# Patient Record
Sex: Male | Born: 1965 | Hispanic: Yes | Marital: Single | Smoking: Never smoker
Health system: Southern US, Community
[De-identification: ages and names within clinical notes are randomized; demographics above are authoritative.]

## PROBLEM LIST (undated history)

## (undated) DIAGNOSIS — G309 Alzheimer's disease, unspecified: Secondary | ICD-10-CM

## (undated) DIAGNOSIS — R4701 Aphasia: Secondary | ICD-10-CM

## (undated) DIAGNOSIS — Q909 Down syndrome, unspecified: Secondary | ICD-10-CM

## (undated) DIAGNOSIS — E119 Type 2 diabetes mellitus without complications: Secondary | ICD-10-CM

## (undated) DIAGNOSIS — R569 Unspecified convulsions: Secondary | ICD-10-CM

## (undated) DIAGNOSIS — E039 Hypothyroidism, unspecified: Secondary | ICD-10-CM

## (undated) DIAGNOSIS — E785 Hyperlipidemia, unspecified: Secondary | ICD-10-CM

## (undated) DIAGNOSIS — F0280 Dementia in other diseases classified elsewhere without behavioral disturbance: Secondary | ICD-10-CM

## (undated) HISTORY — PX: DENTAL SURGERY: SHX609

---

## 2015-10-18 ENCOUNTER — Encounter: Payer: Self-pay | Admitting: Emergency Medicine

## 2015-10-18 ENCOUNTER — Emergency Department: Payer: Medicare (Managed Care)

## 2015-10-18 ENCOUNTER — Emergency Department
Admission: EM | Admit: 2015-10-18 | Discharge: 2015-10-18 | Disposition: A | Discharge status: Home / Self Care | Payer: Medicare (Managed Care) | Attending: Emergency Medicine | Admitting: Emergency Medicine

## 2015-10-18 DIAGNOSIS — Y999 Unspecified external cause status: Secondary | ICD-10-CM | POA: Diagnosis not present

## 2015-10-18 DIAGNOSIS — Y9389 Activity, other specified: Secondary | ICD-10-CM | POA: Insufficient documentation

## 2015-10-18 DIAGNOSIS — W228XXA Striking against or struck by other objects, initial encounter: Secondary | ICD-10-CM | POA: Diagnosis not present

## 2015-10-18 DIAGNOSIS — S0990XA Unspecified injury of head, initial encounter: Secondary | ICD-10-CM | POA: Diagnosis present

## 2015-10-18 DIAGNOSIS — G309 Alzheimer's disease, unspecified: Secondary | ICD-10-CM | POA: Insufficient documentation

## 2015-10-18 DIAGNOSIS — Y9289 Other specified places as the place of occurrence of the external cause: Secondary | ICD-10-CM | POA: Insufficient documentation

## 2015-10-18 DIAGNOSIS — Q909 Down syndrome, unspecified: Secondary | ICD-10-CM | POA: Diagnosis not present

## 2015-10-18 DIAGNOSIS — E119 Type 2 diabetes mellitus without complications: Secondary | ICD-10-CM | POA: Insufficient documentation

## 2015-10-18 HISTORY — DX: Type 2 diabetes mellitus without complications: E11.9

## 2015-10-18 HISTORY — DX: Alzheimer's disease, unspecified: G30.9

## 2015-10-18 HISTORY — DX: Dementia in other diseases classified elsewhere, unspecified severity, without behavioral disturbance, psychotic disturbance, mood disturbance, and anxiety: F02.80

## 2015-10-18 HISTORY — DX: Down syndrome, unspecified: Q90.9

## 2015-10-18 LAB — GLUCOSE, CAPILLARY: Glucose-Capillary: 159 mg/dL — ABNORMAL HIGH (ref 65–99)

## 2015-10-18 MED ORDER — LORAZEPAM 1 MG PO TABS
2.0000 mg | ORAL_TABLET | Freq: Once | ORAL | Status: AC
  Administered 2015-10-18: 2 mg via ORAL
  Filled 2015-10-18: qty 2

## 2015-10-18 NOTE — ED Provider Notes (Signed)
[facility] Emergency Department Provider Note   ____________________________________________   First MD Initiated Contact with Patient 10/18/15 1943     (approximate)  I have reviewed the triage vital signs and the nursing notes.   HISTORY History is given by brother and staff worker secondary to the patient being autistic. Chief Complaint Fall    HPI Martin Randolph is a 50 y.o. male patient presented ED secondary to a head contusion. Patient fell and hit his head on a fountain. Patient was at the doctor's office this occurred. Patient was sent over to his CT of head and also requests for chest CT. Chest CT and head CT is requested by treating doctor. Brother stated there is no loss of conscious patient mentality is a baseline patient is autistic.Patient staff work is also with the patient and she states there is no loss in his baseline mentality.   Past Medical History:  Diagnosis Date  . Alzheimer's dementia   . Diabetes (HCC)   . Down syndrome     There are no active problems to display for this patient.   Past Surgical History:  Procedure Laterality Date  . DENTAL SURGERY      Prior to Admission medications   Not on File    Allergies Review of patient's allergies indicates no known allergies.  History reviewed. No pertinent family history.  Social History Social History  Substance Use Topics  . Smoking status: Never Smoker  . Smokeless tobacco: Never Used  . Alcohol use No    Review of Systems Constitutional: No fever/chills Eyes: No visual changes. ENT: No sore throat. Cardiovascular: Denies chest pain. Respiratory: Denies shortness of breath. Gastrointestinal: No abdominal pain.  No nausea, no vomiting.  No diarrhea.  No constipation. Genitourinary: Negative for dysuria. Musculoskeletal: Negative for back pain. Skin: Negative for rash. Neurological: Negative for headaches, focal weakness or numbness. Endocrine: Diabetes     ____________________________________________   PHYSICAL EXAM:  VITAL SIGNS: ED Triage Vitals  Enc Vitals Group     BP --      Pulse Rate 10/18/15 1721 99     Resp 10/18/15 1721 (!) 24     Temp 10/18/15 1721 98.1 F (36.7 C)     Temp Source 10/18/15 1721 Axillary     SpO2 10/18/15 1721 98 %     Weight 10/18/15 1722 113 lb (51.3 kg)     Height --      Head Circumference --      Peak Flow --      Pain Score --      Pain Loc --      Pain Edu? --      Excl. in GC? --     Constitutional: Alert and oriented. Well appearing and in no acute distress. Eyes: Conjunctivae are normal. PERRL. EOMI. Head: Atraumatic. Nose: No congestion/rhinnorhea. Mouth/Throat: Mucous membranes are moist.  Oropharynx non-erythematous. Neck: No stridor.  No cervical spine tenderness to palpation. Hematological/Lymphatic/Immunilogical: No cervical lymphadenopathy. Cardiovascular: Normal rate, regular rhythm. Grossly normal heart sounds.  Good peripheral circulation. Respiratory: Normal respiratory effort.  No retractions. Lungs CTAB. Gastrointestinal: Soft and nontender. No distention. No abdominal bruits. No CVA tenderness. Musculoskeletal: No lower extremity tenderness nor edema.  No joint effusions. Neurologic:  Patient is autistic Skin:  Skin is warm, dry and intact. No rash noted. Psychiatric: Mood and affect are normal. Speech and behavior are normal.  ____________________________________________   LABS (all labs ordered are listed, but only abnormal results are  displayed)  Labs Reviewed  GLUCOSE, CAPILLARY - Abnormal; Notable for the following:       Result Value   Glucose-Capillary 159 (*)    All other components within normal limits   ____________________________________________  EKG   ____________________________________________  RADIOLOGY   ____________________________________________   PROCEDURES  Procedure(s) performed: None  Procedures  Critical Care performed:  No  ____________________________________________   INITIAL IMPRESSION / ASSESSMENT AND PLAN / ED COURSE  Pertinent labs & imaging results that were available during my care of the patient were reviewed by me and considered in my medical decision making (see chart for details).  Forehead contusion. Patient brother and cares staff worker given discharge care instructions. Advised return by ER. Patient condition worsens.  Clinical Course   Status post Ativan 2 mg by mouth CT was unable to perform the procedure of scanning the head or chest. Discussed condition with brother and staff worker who concur that they noticed no baseline change in the patient's condition. They also noted that there is no abrasion or hematoma to the forehead from the impact.  ____________________________________________   FINAL CLINICAL IMPRESSION(S) / ED DIAGNOSES  Final diagnoses:  Minor head injury, initial encounter      NEW MEDICATIONS STARTED DURING THIS VISIT:  New Prescriptions   No medications on file     Note:  This document was prepared using Dragon voice recognition software and may include unintentional dictation errors.    Joni Reining, PA-C 10/18/15 2115    Loleta Rose, MD 10/19/15 [phone]

## 2015-10-18 NOTE — ED Notes (Signed)
Per patient's caregiver, the patient is unable to tolerate the CT scan.

## 2015-10-18 NOTE — ED Triage Notes (Signed)
Pt presents to ED s/p fall. Pt's brother states he has a hx of down's syndrome, dementia, alzheimers. Pt's brother presents to ED with him and states that patient was walking and fell into a water fountain. Pt hit L side of his head. Pt's brother denies LOC, denies any alterations in mental status at this time. Per brother pt is at baseline. No bruising noted at this time. Pt is reassured by his brother's presence.

## 2015-10-18 NOTE — ED Notes (Signed)
Pt is from [facility].  Pt leaned over and struck top of head on water fountain today.  No loc  No vomiting.  Caregiver with pt.  Pt alert.  Pt has down syndrome.  Care giver states pt acting appropriate for him.   No abrasion or lac noted.

## 2015-10-18 NOTE — ED Notes (Signed)
Patient given medication with peanut butter.

## 2015-10-29 ENCOUNTER — Encounter: Payer: Self-pay | Admitting: *Deleted

## 2015-10-29 ENCOUNTER — Inpatient Hospital Stay
Admission: EM | Admit: 2015-10-29 | Discharge: 2015-11-03 | DRG: 638 | Disposition: A | Discharge status: Home / Self Care | Payer: Medicare (Managed Care) | Attending: Internal Medicine | Admitting: Internal Medicine

## 2015-10-29 DIAGNOSIS — Q909 Down syndrome, unspecified: Secondary | ICD-10-CM | POA: Diagnosis not present

## 2015-10-29 DIAGNOSIS — Z79899 Other long term (current) drug therapy: Secondary | ICD-10-CM

## 2015-10-29 DIAGNOSIS — G4089 Other seizures: Secondary | ICD-10-CM | POA: Diagnosis not present

## 2015-10-29 DIAGNOSIS — F0390 Unspecified dementia without behavioral disturbance: Secondary | ICD-10-CM

## 2015-10-29 DIAGNOSIS — I959 Hypotension, unspecified: Secondary | ICD-10-CM | POA: Diagnosis not present

## 2015-10-29 DIAGNOSIS — E1169 Type 2 diabetes mellitus with other specified complication: Secondary | ICD-10-CM

## 2015-10-29 DIAGNOSIS — E11649 Type 2 diabetes mellitus with hypoglycemia without coma: Secondary | ICD-10-CM | POA: Diagnosis not present

## 2015-10-29 DIAGNOSIS — Z23 Encounter for immunization: Secondary | ICD-10-CM

## 2015-10-29 DIAGNOSIS — E119 Type 2 diabetes mellitus without complications: Secondary | ICD-10-CM

## 2015-10-29 DIAGNOSIS — E162 Hypoglycemia, unspecified: Secondary | ICD-10-CM | POA: Diagnosis present

## 2015-10-29 DIAGNOSIS — Z794 Long term (current) use of insulin: Secondary | ICD-10-CM

## 2015-10-29 DIAGNOSIS — F0280 Dementia in other diseases classified elsewhere without behavioral disturbance: Secondary | ICD-10-CM | POA: Diagnosis present

## 2015-10-29 DIAGNOSIS — R569 Unspecified convulsions: Secondary | ICD-10-CM

## 2015-10-29 LAB — COMPREHENSIVE METABOLIC PANEL
ALBUMIN: 3.8 g/dL (ref 3.5–5.0)
ALK PHOS: 68 U/L (ref 38–126)
ALT: 27 U/L (ref 17–63)
ANION GAP: 9 (ref 5–15)
AST: 31 U/L (ref 15–41)
BUN: 21 mg/dL — AB (ref 6–20)
CALCIUM: 9.3 mg/dL (ref 8.9–10.3)
CO2: 31 mmol/L (ref 22–32)
Chloride: 101 mmol/L (ref 101–111)
Creatinine, Ser: 0.88 mg/dL (ref 0.61–1.24)
GFR calc Af Amer: >60 mL/min (ref >60)
GFR calc non Af Amer: >60 mL/min (ref >60)
GLUCOSE: 32 mg/dL — AB (ref 65–99)
POTASSIUM: 4.5 mmol/L (ref 3.5–5.1)
SODIUM: 141 mmol/L (ref 135–145)
Total Bilirubin: 0.4 mg/dL (ref 0.3–1.2)
Total Protein: 6.9 g/dL (ref 6.5–8.1)

## 2015-10-29 LAB — CBC WITH DIFFERENTIAL/PLATELET
Basophils Absolute: 0.1 10*3/uL (ref 0–0.1)
Basophils Relative: 1 %
EOS PCT: 0 %
Eosinophils Absolute: 0 10*3/uL (ref 0–0.7)
HEMATOCRIT: 42.2 % (ref 40.0–52.0)
Hemoglobin: 14.1 g/dL (ref 13.0–18.0)
LYMPHS PCT: 17 %
Lymphs Abs: 1.5 10*3/uL (ref 1.0–3.6)
MCH: 34.4 pg — AB (ref 26.0–34.0)
MCHC: 33.5 g/dL (ref 32.0–36.0)
MCV: 102.7 fL — AB (ref 80.0–100.0)
MONO ABS: 1.2 10*3/uL — AB (ref 0.2–1.0)
MONOS PCT: 13 %
NEUTROS ABS: 6.2 10*3/uL (ref 1.4–6.5)
Neutrophils Relative %: 69 %
Platelets: 304 10*3/uL (ref 150–440)
RBC: 4.11 MIL/uL — ABNORMAL LOW (ref 4.40–5.90)
RDW: 14.5 % (ref 11.5–14.5)
WBC: 9 10*3/uL (ref 3.8–10.6)

## 2015-10-29 LAB — URINALYSIS COMPLETE WITH MICROSCOPIC (ARMC ONLY)
BACTERIA UA: NONE SEEN
Bilirubin Urine: NEGATIVE
Glucose, UA: >500 mg/dL — AB
Hgb urine dipstick: NEGATIVE
Leukocytes, UA: NEGATIVE
Nitrite: NEGATIVE
PH: 5 (ref 5.0–8.0)
Protein, ur: 30 mg/dL — AB
SQUAMOUS EPITHELIAL / LPF: NONE SEEN
Specific Gravity, Urine: 1.026 (ref 1.005–1.030)

## 2015-10-29 LAB — GLUCOSE, CAPILLARY
GLUCOSE-CAPILLARY: 129 mg/dL — AB (ref 65–99)
Glucose-Capillary: 139 mg/dL — ABNORMAL HIGH (ref 65–99)
Glucose-Capillary: 337 mg/dL — ABNORMAL HIGH (ref 65–99)
Glucose-Capillary: 95 mg/dL (ref 65–99)
Glucose-Capillary: <10 mg/dL — CL (ref 65–99)

## 2015-10-29 LAB — LIPASE, BLOOD: Lipase: 16 U/L (ref 11–51)

## 2015-10-29 MED ORDER — ACETAMINOPHEN 650 MG RE SUPP: 650.0000 mg | Freq: Four times a day (QID) | RECTAL | Status: DC | PRN

## 2015-10-29 MED ORDER — ONDANSETRON HCL 4 MG PO TABS: 4.0000 mg | ORAL_TABLET | Freq: Four times a day (QID) | ORAL | Status: DC | PRN

## 2015-10-29 MED ORDER — SODIUM CHLORIDE 0.9 % IV SOLN
INTRAVENOUS | Status: AC
  Administered 2015-10-30 (×2): via INTRAVENOUS

## 2015-10-29 MED ORDER — SODIUM CHLORIDE 0.9% FLUSH
3.0000 mL | Freq: Two times a day (BID) | INTRAVENOUS | Status: DC
  Administered 2015-10-30 – 2015-11-02 (×3): 3 mL via INTRAVENOUS

## 2015-10-29 MED ORDER — SIMVASTATIN 10 MG PO TABS
10.0000 mg | ORAL_TABLET | Freq: Every day | ORAL | Status: DC
  Administered 2015-10-30 – 2015-11-02 (×5): 10 mg via ORAL
  Filled 2015-10-29 (×5): qty 1

## 2015-10-29 MED ORDER — ENOXAPARIN SODIUM 40 MG/0.4ML ~~LOC~~ SOLN
40.0000 mg | Freq: Every day | SUBCUTANEOUS | Status: DC
  Administered 2015-10-30 – 2015-11-02 (×5): 40 mg via SUBCUTANEOUS
  Filled 2015-10-29 (×5): qty 0.4

## 2015-10-29 MED ORDER — DEXTROSE 50 % IV SOLN
1.0000 | Freq: Once | INTRAVENOUS | Status: AC
  Administered 2015-10-29: 50 mL via INTRAVENOUS

## 2015-10-29 MED ORDER — ACETAMINOPHEN 325 MG PO TABS
650.0000 mg | ORAL_TABLET | Freq: Four times a day (QID) | ORAL | Status: DC | PRN
  Administered 2015-11-01: 650 mg via ORAL
  Filled 2015-10-29: qty 2

## 2015-10-29 MED ORDER — DONEPEZIL HCL 5 MG PO TABS
10.0000 mg | ORAL_TABLET | Freq: Every day | ORAL | Status: DC
  Administered 2015-10-30 – 2015-11-02 (×5): 10 mg via ORAL
  Filled 2015-10-29 (×5): qty 2

## 2015-10-29 MED ORDER — ONDANSETRON HCL 4 MG/2ML IJ SOLN: 4.0000 mg | Freq: Four times a day (QID) | INTRAMUSCULAR | Status: DC | PRN

## 2015-10-29 MED ORDER — TAMSULOSIN HCL 0.4 MG PO CAPS
0.4000 mg | ORAL_CAPSULE | Freq: Every day | ORAL | Status: DC
  Administered 2015-10-30 – 2015-11-02 (×5): 0.4 mg via ORAL
  Filled 2015-10-29 (×5): qty 1

## 2015-10-29 MED ORDER — FAMOTIDINE 20 MG PO TABS
20.0000 mg | ORAL_TABLET | Freq: Every day | ORAL | Status: DC
  Administered 2015-10-30 – 2015-11-03 (×5): 20 mg via ORAL
  Filled 2015-10-29 (×5): qty 1

## 2015-10-29 MED ORDER — DIVALPROEX SODIUM 125 MG PO CSDR
250.0000 mg | DELAYED_RELEASE_CAPSULE | Freq: Three times a day (TID) | ORAL | Status: DC
  Administered 2015-10-30 – 2015-11-01 (×7): 250 mg via ORAL
  Filled 2015-10-29 (×7): qty 2

## 2015-10-29 MED ORDER — DEXTROSE 10 % IV SOLN
INTRAVENOUS | Status: DC
Start: 2015-10-29 — End: 2015-10-29
  Administered 2015-10-29: 21:00:00 via INTRAVENOUS

## 2015-10-29 MED ORDER — QUETIAPINE FUMARATE 25 MG PO TABS
25.0000 mg | ORAL_TABLET | Freq: Every day | ORAL | Status: DC
  Administered 2015-10-30 – 2015-11-02 (×4): 25 mg via ORAL
  Filled 2015-10-29 (×5): qty 1

## 2015-10-29 NOTE — ED Triage Notes (Addendum)
PT arrived to ED via EMS from [facility]. Pt was reported to have an elevated CBG of 543 at 1400. Per MD order pt was given 12 units of insulin at 1415. CBG upon recheck at 1500 was 489 and 12 more units given at approx.1514. Staff reported pt began to look tired and sluggish and gave pt unsweetened applesauce. No improvement noted and CBG checked again at 1744 pt's CBG was 31 and staff immediately administered 1 tube of oral glucose. Staff reports pt had 4 witnessed seizures that were 30 seconds each and 2 more three second seizures that staff describes as twitching into full body jerking. Upon EMS arrival CBG was 22 and ES started a line and gave 1 amp of D50. Upon arrival pt has CBG of 95. PT has hx of Alzhimers, Diabetes and Down's and is reported to be Nonverbal but able to follow commands. Pt following commands upon arrival but sleeping on and off. NAD noted. No trauma noted to tongue and no loss of bowels.

## 2015-10-29 NOTE — ED Provider Notes (Signed)
[facility] Emergency Department Provider Note  ____________________________________________   First MD Initiated Contact with Patient 10/29/15 1949     (approximate)  I have reviewed the triage vital signs and the nursing notes.   HISTORY  Chief Complaint Hypoglycemia  The patient has Down syndrome, early onset dementia, and possibly a history of TBI as well.  He is not able to provide any history.  HPI Martin Randolph is a 50 y.o. 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  The representative reports that the patient has widely variable blood sugars, ranging from the 400s to the 100s over a short period of time.  See nursing notes for specifics, but in summary, he had a FSBS that was well over 500, so he was given insulin.  A repeat FSBS showed another result in the mid-400s, so he received more insulin.  An hour later he was in the 90s, and then they noticed that he was very sleepy.  They checked and found a FSBS in the low 30s.  He was given dextrose PTA by EMS and his FSBS was in the 90s upon arrival to the ED.  His baseline is difficult to assess.  Group home feels he is more or less at his baseline "when his sugar is messed up."  He will occasionally wake up and yell and track [place] in the room, but is minimally cooperative.  Past Medical History:  Diagnosis Date  . Alzheimer's dementia   . Diabetes (HCC)   . Down syndrome     Patient Active Problem List   Diagnosis Date Noted  . Hypoglycemia 10/29/2015  . Diabetes (HCC) 10/29/2015  . Down syndrome 10/29/2015    Past Surgical History:  Procedure Laterality Date  . DENTAL SURGERY      Prior to Admission medications   Medication Sig Start Date End Date Taking? Authorizing Provider  acetaminophen (TYLENOL) 325 MG tablet Take  650 mg by mouth every 6 (six) hours as needed for mild pain.   Yes Historical Provider, MD  carbamide peroxide (DEBROX) 6.5 % otic solution Place 5-10 drops into both ears 2 (two) times daily.   Yes Historical Provider, MD  cetaphil (CETAPHIL) lotion Apply 1 application topically 2 (two) times daily.   Yes Historical Provider, MD  divalproex (DEPAKOTE SPRINKLE) 125 MG capsule Take 250 mg by mouth 3 (three) times daily.   Yes Historical Provider, MD  donepezil (ARICEPT) 10 MG tablet Take 10 mg by mouth at bedtime.   Yes Historical Provider, MD  famotidine (PEPCID) 20 MG tablet Take 20 mg by mouth daily.   Yes Historical Provider, MD  feeding supplement, GLUCERNA SHAKE, (GLUCERNA SHAKE) LIQD Take 237 mLs by mouth 3 (three) times daily between meals.   Yes Historical Provider, MD  gabapentin (NEURONTIN) 400 MG capsule Take 400 mg by mouth at bedtime.   Yes Historical Provider, MD  insulin glargine (LANTUS) 100 UNIT/ML injection Inject 12 Units into the skin at bedtime.   Yes Historical Provider, MD  insulin lispro (HUMALOG) 100 UNIT/ML injection Inject 0-10 Units into the skin 3 (three) times daily after meals.   Yes Historical Provider, MD  Melatonin 3 MG TABS Take 3 mg by mouth at bedtime.   Yes Historical Provider, MD  metFORMIN (GLUCOPHAGE) 1000 MG tablet Take 1,000 mg by mouth 2 (  two) times daily with a meal.   Yes Historical Provider, MD  polyethylene glycol (MIRALAX / GLYCOLAX) packet Take 17 g by mouth every 3 (three) days.   Yes Historical Provider, MD  QUEtiapine (SEROQUEL) 25 MG tablet Take 25 mg by mouth at bedtime.   Yes Historical Provider, MD  QUEtiapine (SEROQUEL) 25 MG tablet Take 25 mg by mouth 2 (two) times daily as needed (agitation).   Yes Historical Provider, MD  QUEtiapine (SEROQUEL) 50 MG tablet Take 50 mg by mouth 2 (two) times daily.   Yes Historical Provider, MD  simvastatin (ZOCOR) 10 MG tablet Take 10 mg by mouth at bedtime.   Yes Historical Provider, MD  tamsulosin  (FLOMAX) 0.4 MG CAPS capsule Take 0.4 mg by mouth at bedtime.   Yes Historical Provider, MD    Allergies Review of patient's allergies indicates no known allergies.  History reviewed. No pertinent family history.  Social History Social History  Substance Use Topics  . Smoking status: Never Smoker  . Smokeless tobacco: Never Used  . Alcohol use No    Review of Systems Level V caveat - unable to obtain due to chronic dementia and Down syndrome ____________________________________________   PHYSICAL EXAM:  VITAL SIGNS: ED Triage Vitals  Enc Vitals Group     BP 10/29/15 1946 103/77     Pulse Rate 10/29/15 1946 64     Resp 10/29/15 1946 16     Temp 10/29/15 1946 98.9 F (37.2 C)     Temp Source 10/29/15 1946 Oral     SpO2 10/29/15 1946 100 %     Weight 10/29/15 1948 113 lb (51.3 kg)     Height --      Head Circumference --      Peak Flow --      Pain Score --      Pain Loc --      Pain Edu? --      Excl. in GC? --     Constitutional: Somnolent, occasionally more alert, similar to baseline per group home representative Eyes: Conjunctivae are normal. PERRL. EOMI. Head: Atraumatic. Nose: No congestion/rhinnorhea. Mouth/Throat: Mucous membranes are moist.  Oropharynx non-erythematous. Neck: No stridor.  No meningeal signs.   Cardiovascular: Normal rate, regular rhythm. Good peripheral circulation. Grossly normal heart sounds. Respiratory: Normal respiratory effort.  No retractions. Lungs CTAB. Gastrointestinal: Soft and nontender. No distention.  Musculoskeletal: No lower extremity tenderness nor edema. No gross deformities of extremities. Neurologic:  Moving all four extremities.  Unable to participate in neuro exam.  No seizure-like activity. Skin:  Skin is warm, dry and intact. No rash noted.   ____________________________________________   LABS (all labs ordered are listed, but only abnormal results are displayed)  Labs Reviewed  COMPREHENSIVE METABOLIC PANEL  - Abnormal; Notable for the following:       Result Value   Glucose, Bld 32 (*)    BUN 21 (*)    All other components within normal limits  CBC WITH DIFFERENTIAL/PLATELET - Abnormal; Notable for the following:    RBC 4.11 (*)    MCV 102.7 (*)    MCH 34.4 (*)    Monocytes Absolute 1.2 (*)    All other components within normal limits  GLUCOSE, CAPILLARY - Abnormal; Notable for the following:    Glucose-Capillary <10 (*)    All other components within normal limits  URINALYSIS COMPLETEWITH MICROSCOPIC ([facility] ONLY) - Abnormal; Notable for the following:    Color, Urine YELLOW (*)  APPearance CLEAR (*)    Glucose, UA >500 (*)    Ketones, ur 1+ (*)    Protein, ur 30 (*)    All other components within normal limits  GLUCOSE, CAPILLARY - Abnormal; Notable for the following:    Glucose-Capillary 139 (*)    All other components within normal limits  GLUCOSE, CAPILLARY - Abnormal; Notable for the following:    Glucose-Capillary 129 (*)    All other components within normal limits  GLUCOSE, CAPILLARY  LIPASE, BLOOD  CBC  CREATININE, SERUM  BASIC METABOLIC PANEL  CBC  CBG MONITORING, ED  CBG MONITORING, ED  CBG MONITORING, ED  CBG MONITORING, ED  CBG MONITORING, ED  CBG MONITORING, ED  CBG MONITORING, ED  CBG MONITORING, ED  CBG MONITORING, ED  CBG MONITORING, ED  CBG MONITORING, ED  CBG MONITORING, ED  CBG MONITORING, ED  CBG MONITORING, ED  CBG MONITORING, ED   ____________________________________________  EKG  None - EKG not ordered by ED physician ____________________________________________  RADIOLOGY   No results found.  ____________________________________________   PROCEDURES  Procedure(s) performed:   .Critical Care Performed by: Loleta Rose Authorized by: Loleta Rose   Critical care provider statement:    Critical care time (minutes):  30   Critical care time was exclusive of:  Separately billable procedures and treating other patients    Critical care was necessary to treat or prevent imminent or life-threatening deterioration of the following conditions:  Metabolic crisis   Critical care was time spent personally by me on the following activities:  Development of treatment plan with patient or surrogate, discussions with consultants, evaluation of patient's response to treatment, examination of patient, obtaining history from patient or surrogate, ordering and performing treatments and interventions, ordering and review of laboratory studies, ordering and review of radiographic studies, pulse oximetry, re-evaluation of patient's condition and review of old charts      Critical Care performed: Yes, see critical care procedure note(s) ____________________________________________   INITIAL IMPRESSION / ASSESSMENT AND PLAN / ED COURSE  Pertinent labs & imaging results that were available during my care of the patient were reviewed by me and considered in my medical decision making (see chart for details).  Likely too much insulin in a short period of time.  No evidence of infectious process (metabolic panel and CBC pending, but VSS/afebrile.  No report of recent illness from group home.  Will monitor with q1hour  CBG checks, next check at 20:45.  Clinical Course  Comment By Time  The patient's fingerstick blood glucose was less than 10 (unmeasurable).  No seizure-like activity.  Administered one ampule of D50 and started on a D10 drip at 100 mL per hour. Loleta Rose, MD 09/25 2045  Refractory hypoglycemia requiring emergent intervention, concerned that he will have additional potentially life-threatening drops in blood sugar. Will admit for D10 drip and observation.  UA pending but blood work unremarkable. Loleta Rose, MD 09/25 2116    ____________________________________________  FINAL CLINICAL IMPRESSION(S) / ED DIAGNOSES  Final diagnoses:  Hypoglycemia  Down syndrome  Chronic dementia, without behavioral disturbance      MEDICATIONS GIVEN DURING THIS VISIT:  Medications  dextrose 10 % infusion ( Intravenous New Bag/Given 10/29/15 2048)  divalproex (DEPAKOTE SPRINKLE) capsule 250 mg (not administered)  donepezil (ARICEPT) tablet 10 mg (not administered)  simvastatin (ZOCOR) tablet 10 mg (not administered)  tamsulosin (FLOMAX) capsule 0.4 mg (not administered)  famotidine (PEPCID) tablet 20 mg (not administered)  enoxaparin (LOVENOX) injection 40 mg (  not administered)  sodium chloride flush (NS) 0.9 % injection 3 mL (not administered)  acetaminophen (TYLENOL) tablet 650 mg (not administered)    Or  acetaminophen (TYLENOL) suppository 650 mg (not administered)  ondansetron (ZOFRAN) tablet 4 mg (not administered)    Or  ondansetron (ZOFRAN) injection 4 mg (not administered)  dextrose 50 % solution 50 mL (50 mLs Intravenous Given 10/29/15 2048)     NEW OUTPATIENT MEDICATIONS STARTED DURING THIS VISIT:  New Prescriptions   No medications on file    Modified Medications   No medications on file    Discontinued Medications   No medications on file     Note:  This document was prepared using Dragon voice recognition software and may include unintentional dictation errors.    Loleta Rose, MD 10/29/15 [phone]

## 2015-10-29 NOTE — H&P (Signed)
[facility] - [place] [facility]   PATIENT NAME: Martin Randolph    MR#:  [id number]  DATE OF BIRTH:  1965-12-21  DATE OF ADMISSION:  10/29/2015  PRIMARY CARE PHYSICIAN: Pasty Spillers McLean-Scocuzza, MD   REQUESTING/REFERRING PHYSICIAN: York Cerise, MD  CHIEF COMPLAINT:   Chief Complaint  Patient presents with  . Hypoglycemia    HISTORY OF PRESENT ILLNESS:  Martin Randolph  is a 50 y.o. male who presents with hypoglycemia. Patient has Down syndrome and dementia and lives in a group home. He is nonverbal at baseline and history is taken from the group home worker who is with him in the ED. She states that he had elevated blood glucose values earlier today in the 400s and 500s. He was given correctional insulin 2 times. His blood sugar this evening was 93 and then subsequently in the 60s. EMS was called and they gave him glucose and his blood sugar improved. Here in the ED it dropped again and he was put on a dextrose drip and hospitalists were called for admission for observation.  PAST MEDICAL HISTORY:   Past Medical History:  Diagnosis Date  . Alzheimer's dementia   . Diabetes (HCC)   . Down syndrome     PAST SURGICAL HISTORY:   Past Surgical History:  Procedure Laterality Date  . DENTAL SURGERY      SOCIAL HISTORY:   Social History  Substance Use Topics  . Smoking status: Never Smoker  . Smokeless tobacco: Never Used  . Alcohol use No    FAMILY HISTORY:  History reviewed. No pertinent family history.  DRUG ALLERGIES:  No Known Allergies  MEDICATIONS AT HOME:   Prior to Admission medications   Medication Sig Start Date End Date Taking? Authorizing Provider  acetaminophen (TYLENOL) 325 MG tablet Take 650 mg by mouth every 6 (six) hours as needed for mild pain.   Yes Historical Provider, MD  carbamide peroxide (DEBROX) 6.5 % otic solution Place 5-10 drops into both ears 2 (two) times daily.   Yes Historical Provider, MD  cetaphil (CETAPHIL)  lotion Apply 1 application topically 2 (two) times daily.   Yes Historical Provider, MD  divalproex (DEPAKOTE SPRINKLE) 125 MG capsule Take 250 mg by mouth 3 (three) times daily.   Yes Historical Provider, MD  donepezil (ARICEPT) 10 MG tablet Take 10 mg by mouth at bedtime.   Yes Historical Provider, MD  famotidine (PEPCID) 20 MG tablet Take 20 mg by mouth daily.   Yes Historical Provider, MD  feeding supplement, GLUCERNA SHAKE, (GLUCERNA SHAKE) LIQD Take 237 mLs by mouth 3 (three) times daily between meals.   Yes Historical Provider, MD  gabapentin (NEURONTIN) 400 MG capsule Take 400 mg by mouth at bedtime.   Yes Historical Provider, MD  insulin glargine (LANTUS) 100 UNIT/ML injection Inject 12 Units into the skin at bedtime.   Yes Historical Provider, MD  insulin lispro (HUMALOG) 100 UNIT/ML injection Inject 0-10 Units into the skin 3 (three) times daily after meals.   Yes Historical Provider, MD  Melatonin 3 MG TABS Take 3 mg by mouth at bedtime.   Yes Historical Provider, MD  metFORMIN (GLUCOPHAGE) 1000 MG tablet Take 1,000 mg by mouth 2 (two) times daily with a meal.   Yes Historical Provider, MD  polyethylene glycol (MIRALAX / GLYCOLAX) packet Take 17 g by mouth every 3 (three) days.   Yes Historical Provider, MD  QUEtiapine (SEROQUEL) 25 MG tablet Take 25 mg by mouth at bedtime.  Yes Historical Provider, MD  QUEtiapine (SEROQUEL) 25 MG tablet Take 25 mg by mouth 2 (two) times daily as needed (agitation).   Yes Historical Provider, MD  QUEtiapine (SEROQUEL) 50 MG tablet Take 50 mg by mouth 2 (two) times daily.   Yes Historical Provider, MD  simvastatin (ZOCOR) 10 MG tablet Take 10 mg by mouth at bedtime.   Yes Historical Provider, MD  tamsulosin (FLOMAX) 0.4 MG CAPS capsule Take 0.4 mg by mouth at bedtime.   Yes Historical Provider, MD    REVIEW OF SYSTEMS:  Review of Systems  Unable to perform ROS: Dementia     VITAL SIGNS:   Vitals:   10/29/15 1946 10/29/15 1948  BP: 103/77    Pulse: 64   Resp: 16   Temp: 98.9 F (37.2 C)   TempSrc: Oral   SpO2: 100%   Weight:  51.3 kg (113 lb)   Wt Readings from Last 3 Encounters:  10/29/15 51.3 kg (113 lb)  10/18/15 51.3 kg (113 lb)    PHYSICAL EXAMINATION:  Physical Exam  Vitals reviewed. Constitutional: He appears well-developed and well-nourished. No distress.  HENT:  Head: Normocephalic and atraumatic.  Mouth/Throat: Oropharynx is clear and moist.  Eyes: Conjunctivae and EOM are normal. Pupils are equal, round, and reactive to light. No scleral icterus.  Neck: Normal range of motion. Neck supple. No JVD present. No thyromegaly present.  Cardiovascular: Normal rate, regular rhythm and intact distal pulses.  Exam reveals no gallop and no friction rub.   No murmur heard. Respiratory: Effort normal and breath sounds normal. No respiratory distress. He has no wheezes. He has no rales.  GI: Soft. Bowel sounds are normal. He exhibits no distension. There is no tenderness.  Musculoskeletal: Normal range of motion. He exhibits no edema.  No arthritis, no gout  Lymphadenopathy:    He has no cervical adenopathy.  Neurological: He is alert.  Unable to fully assess as patient does not follow commands at baseline  Skin: Skin is warm and dry. No rash noted. No erythema.  Psychiatric:  Unable to assess due to dementia    LABORATORY PANEL:   CBC  Recent Labs Lab 10/29/15 1949  WBC 9.0  HGB 14.1  HCT 42.2  PLT 304   ------------------------------------------------------------------------------------------------------------------  Chemistries   Recent Labs Lab 10/29/15 1949  NA 141  K 4.5  CL 101  CO2 31  GLUCOSE 32*  BUN 21*  CREATININE 0.88  CALCIUM 9.3  AST 31  ALT 27  ALKPHOS 68  BILITOT 0.4   ------------------------------------------------------------------------------------------------------------------  Cardiac Enzymes No results for input(s): TROPONINI in the last 168  hours. ------------------------------------------------------------------------------------------------------------------  RADIOLOGY:  No results found.  EKG:  No orders found for this or any previous visit.  IMPRESSION AND PLAN:  Principal Problem:   Hypoglycemia - suspect patient got somewhat appropriate correctional insulin today, but then likely had poor by mouth intake afterwards leading to hypoglycemia. He also has a history of difficult to control blood glucoses which tend to swing wildly from hyperglycemic to hypoglycemic, and vice versa. We'll have him on a dextrose drip here tonight until his blood sugars improve, then we can wean him off the drip and continue to monitor him. Active Problems:   Diabetes (HCC) - carb modified diet, glucose control as described above   Down syndrome - continue home meds for his dementia and behavioral meds  All the records are reviewed and case discussed with ED provider. Management plans discussed with the patient and/or family.  DVT PROPHYLAXIS: SubQ lovenox  GI PROPHYLAXIS: H2 blocker  ADMISSION STATUS: Observation  CODE STATUS: Full Code Status History    This patient does not have a recorded code status. Please follow your organizational policy for patients in this situation.      TOTAL TIME TAKING CARE OF THIS PATIENT: 40 minutes.    Balinda Heacock FIELDING 10/29/2015, 9:54 PM  [facility]  Office  [phone]  CC: Primary care physician; Pasty Spillers McLean-Scocuzza, MD

## 2015-10-30 LAB — GLUCOSE, CAPILLARY
GLUCOSE-CAPILLARY: 286 mg/dL — AB (ref 65–99)
GLUCOSE-CAPILLARY: 306 mg/dL — AB (ref 65–99)
GLUCOSE-CAPILLARY: 242 mg/dL — AB (ref 65–99)
GLUCOSE-CAPILLARY: 305 mg/dL — AB (ref 65–99)
GLUCOSE-CAPILLARY: 495 mg/dL — AB (ref 65–99)
Glucose-Capillary: 217 mg/dL — ABNORMAL HIGH (ref 65–99)
Glucose-Capillary: 248 mg/dL — ABNORMAL HIGH (ref 65–99)
Glucose-Capillary: 314 mg/dL — ABNORMAL HIGH (ref 65–99)
Glucose-Capillary: 128 mg/dL — ABNORMAL HIGH (ref 65–99)

## 2015-10-30 LAB — CBC
HCT: 41.9 % (ref 40.0–52.0)
Hemoglobin: 14.3 g/dL (ref 13.0–18.0)
MCH: 35.1 pg — ABNORMAL HIGH (ref 26.0–34.0)
MCHC: 34.1 g/dL (ref 32.0–36.0)
MCV: 103 fL — AB (ref 80.0–100.0)
Platelets: 290 10*3/uL (ref 150–440)
RBC: 4.07 MIL/uL — AB (ref 4.40–5.90)
RDW: 14.2 % (ref 11.5–14.5)
WBC: 6.4 10*3/uL (ref 3.8–10.6)

## 2015-10-30 LAB — BASIC METABOLIC PANEL
Anion gap: 6 (ref 5–15)
BUN: 17 mg/dL (ref 6–20)
CHLORIDE: 101 mmol/L (ref 101–111)
CO2: 31 mmol/L (ref 22–32)
CREATININE: 0.69 mg/dL (ref 0.61–1.24)
Calcium: 8.9 mg/dL (ref 8.9–10.3)
GFR calc non Af Amer: >60 mL/min (ref >60)
Glucose, Bld: 278 mg/dL — ABNORMAL HIGH (ref 65–99)
Potassium: 4.2 mmol/L (ref 3.5–5.1)
SODIUM: 138 mmol/L (ref 135–145)

## 2015-10-30 LAB — MRSA PCR SCREENING: MRSA BY PCR: NEGATIVE

## 2015-10-30 MED ORDER — SODIUM CHLORIDE 0.9 % IV SOLN: INTRAVENOUS | Status: DC

## 2015-10-30 MED ORDER — CARBAMIDE PEROXIDE 6.5 % OT SOLN
5.0000 [drp] | Freq: Two times a day (BID) | OTIC | Status: DC
  Administered 2015-10-30 (×2): 5 [drp] via OTIC
  Administered 2015-10-31: 10:00:00 7 [drp] via OTIC
  Administered 2015-10-31 – 2015-11-03 (×6): 5 [drp] via OTIC
  Filled 2015-10-30: qty 15

## 2015-10-30 MED ORDER — GABAPENTIN 400 MG PO CAPS
400.0000 mg | ORAL_CAPSULE | Freq: Every day | ORAL | Status: DC
  Administered 2015-10-30 – 2015-11-02 (×4): 400 mg via ORAL
  Filled 2015-10-30 (×4): qty 1

## 2015-10-30 MED ORDER — METFORMIN HCL 500 MG PO TABS
1000.0000 mg | ORAL_TABLET | Freq: Two times a day (BID) | ORAL | Status: DC
  Administered 2015-10-30 – 2015-11-03 (×7): 1000 mg via ORAL
  Filled 2015-10-30 (×7): qty 2

## 2015-10-30 MED ORDER — QUETIAPINE FUMARATE 25 MG PO TABS
50.0000 mg | ORAL_TABLET | Freq: Two times a day (BID) | ORAL | Status: DC
  Administered 2015-10-30 – 2015-11-03 (×8): 50 mg via ORAL
  Filled 2015-10-30 (×7): qty 2

## 2015-10-30 MED ORDER — SODIUM CHLORIDE 0.9 % IV SOLN: INTRAVENOUS | Status: AC

## 2015-10-30 MED ORDER — INSULIN ASPART 100 UNIT/ML ~~LOC~~ SOLN
0.0000 [IU] | Freq: Three times a day (TID) | SUBCUTANEOUS | Status: DC
  Administered 2015-10-30: 13:00:00 9 [IU] via SUBCUTANEOUS
  Administered 2015-10-30: 18:00:00 7 [IU] via SUBCUTANEOUS
  Administered 2015-10-31: 12:00:00 9 [IU] via SUBCUTANEOUS
  Administered 2015-11-01: 7 [IU] via SUBCUTANEOUS
  Administered 2015-11-01 – 2015-11-02 (×3): 9 [IU] via SUBCUTANEOUS
  Administered 2015-11-02: 17:00:00 1 [IU] via SUBCUTANEOUS
  Filled 2015-10-30 (×5): qty 9
  Filled 2015-10-30 (×2): qty 7
  Filled 2015-10-30: qty 1

## 2015-10-30 MED ORDER — MELATONIN 3 MG PO TABS
3.0000 mg | ORAL_TABLET | Freq: Every day | ORAL | Status: DC
  Filled 2015-10-30: qty 1

## 2015-10-30 MED ORDER — POLYETHYLENE GLYCOL 3350 17 G PO PACK
17.0000 g | PACK | ORAL | Status: DC
  Administered 2015-10-31: 17 g via ORAL
  Filled 2015-10-30: qty 1

## 2015-10-30 MED ORDER — MELATONIN 5 MG PO TABS
5.0000 mg | ORAL_TABLET | Freq: Every day | ORAL | Status: DC
  Administered 2015-10-30 – 2015-11-02 (×4): 5 mg via ORAL
  Filled 2015-10-30 (×5): qty 1

## 2015-10-30 MED ORDER — QUETIAPINE FUMARATE 25 MG PO TABS: 25.0000 mg | ORAL_TABLET | Freq: Two times a day (BID) | ORAL | Status: DC | PRN

## 2015-10-30 MED ORDER — INSULIN GLARGINE 100 UNIT/ML ~~LOC~~ SOLN
8.0000 [IU] | Freq: Every day | SUBCUTANEOUS | Status: DC
  Administered 2015-10-30 – 2015-10-31 (×2): 8 [IU] via SUBCUTANEOUS
  Filled 2015-10-30 (×4): qty 0.08

## 2015-10-30 MED ORDER — INFLUENZA VAC SPLIT QUAD 0.5 ML IM SUSY
0.5000 mL | PREFILLED_SYRINGE | INTRAMUSCULAR | Status: AC
  Administered 2015-10-31: 10:00:00 0.5 mL via INTRAMUSCULAR
  Filled 2015-10-30: qty 0.5

## 2015-10-30 NOTE — Progress Notes (Addendum)
Inpatient Diabetes Program Recommendations  AACE/ADA: New Consensus Statement on Inpatient Glycemic Control (2015)  Target Ranges:  Prepandial:   less than 140 mg/dL      Peak postprandial:   less than 180 mg/dL (1-2 hours)      Critically ill patients:  140 - 180 mg/dL   Results for Pieter PartridgeBONILLA, Ovid (MRN [id number]) as of 10/30/2015 08:31  Ref. Range 10/29/2015 19:43 10/29/2015 20:40 10/29/2015 21:44 10/29/2015 22:49 10/29/2015 23:43 10/30/2015 01:02 10/30/2015 02:02 10/30/2015 03:17 10/30/2015 04:08 10/30/2015 06:02  Glucose-Capillary Latest Ref Range: 65 - 99 mg/dL 95 <46<10 (LL) 962139 (H) 952129 (H) 337 (H) 217 (H) 286 (H) 248 (H) 306 (H) 242 (H)  Results for Pieter PartridgeBONILLA, Yasser (MRN [id number]) as of 10/30/2015 08:31  Ref. Range 10/29/2015 19:49 10/30/2015 05:28  Glucose Latest Ref Range: 65 - 99 mg/dL 32 (LL) 027278 (H)   Review of Glycemic Control  Diabetes history: DM2 Outpatient Diabetes medications: Lantus 12 units QHS, Humalog 0-10 units TID with meals, Metformin 1000 mg BID Current orders for Inpatient glycemic control: None  Inpatient Diabetes Program Recommendations: Insulin - Basal: Please consider ordering Lantus 10 units Q24H (starting now). Correction (SSI): Please consider ordering Novolog 0-9 units TID with meals and Novolog 0-5 units QHS.  NOTE: In reviewing chart, note patient has dementia, Down Syndrome, is nonverbal, and lives at a group home. According to ED RN note by Ermalene SearingS. Martin, RN on 10/29/15 patient had "CBG of 543 at 1400. Per MD order pt was given 12 units of insulin at 1415. CBG upon recheck at 1500 was 489 and 12 more units given at approx.1514. Staff reported pt began to look tired and sluggish and gave pt unsweetened applesauce. No improvement noted and CBG checked again at 1744 pt's CBG was 31 and staff immediately administered 1 tube of oral glucose. Staff reports pt had 4 witnessed seizures that were 30 seconds each and 2 more three second seizures that staff describes as twitching into full  body jerking. Upon EMS arrival CBG was 22 and ES started a line and gave 1 amp of D50. Upon arrival pt has CBG of 95."  Patient received a total of Humalog 24 units within 1 hour (12 units at 14:15 and 12 units at 15:14) on 10/29/15 for hyperglycemia. Due to large amount of Huamlog insulin given over a short period of time, patient experienced hypoglycemia and seizures. Do NOT recommend patient receive correction insulin any closer than Q4H when treating hyperglycemia.   Thanks, Orlando PennerMarie Giannis Corpuz, RN, MSN, CDE Diabetes Coordinator Inpatient Diabetes Program [phone] (Team Pager from 8am to 5pm) [phone] ([facility] office) [phone] [facility] office) [phone] [facility] office)

## 2015-10-30 NOTE — Progress Notes (Signed)
[facility] - [place] at [facility]                                                                                                                                                                                            Patient Demographics   Martin Randolph, is a 50 y.o. male, DOB - 10/17/1965, JXB:[id number]  Admit date - 10/29/2015   Admitting Physician Oralia Manisavid Willis, MD  Outpatient Primary MD for the patient is Pasty Spillersracy N McLean-Scocuzza, MD   LOS - 0  Subjective: Patient admitted with hypoglycemia now blood sugars are elevated. Also intermittently low blood pressure    Review of Systems:   CONSTITUTIONAL: Unable to provide due to his cognitive impairment   Vitals:   Vitals:   10/29/15 2351 10/30/15 0412 10/30/15 1114 10/30/15 1349  BP: 100/77 91/71 97/67  (!) 112/46  Pulse: 68 60 66 97  Resp: 18 19    Temp: 97.5 F (36.4 C) 97.5 F (36.4 C)  98 F (36.7 C)  TempSrc: Oral Oral  Oral  SpO2: 98% 95% (!) 76% 100%  Weight:      Height:        Wt Readings from Last 3 Encounters:  10/29/15 123 lb 1.6 oz (55.8 kg)  10/18/15 113 lb (51.3 kg)     Intake/Output Summary (Last 24 hours) at 10/30/15 1353 Last data filed at 10/30/15 0800  Gross per 24 hour  Intake           563.75 ml  Output                0 ml  Net           563.75 ml    Physical Exam:   GENERAL: Pleasant-appearing in no apparent distress.  HEAD, EYES, EARS, NOSE AND THROAT: Atraumatic, normocephalic.  Pupils equal and reactive to light. Sclerae anicteric. No conjunctival injection. No oro-pharyngeal erythema.  NECK: Supple. There is no jugular venous distention. No bruits, no lymphadenopathy, no thyromegaly.  HEART: Regular rate and rhythm,. No murmurs, no rubs, no clicks.  LUNGS: Clear to auscultation bilaterally. No rales or rhonchi. No wheezes.  ABDOMEN: Soft, flat, nontender, nondistended. Has good bowel sounds. No hepatosplenomegaly appreciated.  EXTREMITIES: No evidence  of any cyanosis, clubbing, or peripheral edema.  +2 pedal and radial pulses bilaterally.  NEUROLOGIC: The patient is alert, awake, not oriented to place person or time with no focal motor or sensory deficits appreciated bilaterally.  SKIN: Moist and warm with no rashes appreciated.  Psych: Not anxious, depressed LN: No inguinal LN enlargement    Antibiotics   Anti-infectives  None      Medications   Scheduled Meds: . carbamide peroxide  5-10 drop Both Ears BID  . divalproex  250 mg Oral TID  . donepezil  10 mg Oral QHS  . enoxaparin (LOVENOX) injection  40 mg Subcutaneous QHS  . famotidine  20 mg Oral Daily  . gabapentin  400 mg Oral QHS  . insulin aspart  0-9 Units Subcutaneous TID WC  . insulin glargine  8 Units Subcutaneous QHS  . Melatonin  3 mg Oral QHS  . metFORMIN  1,000 mg Oral BID WC  . [START ON 10/31/2015] polyethylene glycol  17 g Oral Q3 days  . QUEtiapine  25 mg Oral QHS  . QUEtiapine  50 mg Oral BID  . simvastatin  10 mg Oral QHS  . sodium chloride flush  3 mL Intravenous Q12H  . tamsulosin  0.4 mg Oral QHS   Continuous Infusions: . sodium chloride 75 mL/hr at 10/30/15 1333   PRN Meds:.acetaminophen **OR** acetaminophen, ondansetron **OR** ondansetron (ZOFRAN) IV, QUEtiapine   Data Review:   Micro Results Recent Results (from the past 240 hour(s))  MRSA PCR Screening     Status: None   Collection Time: 10/30/15  4:59 AM  Result Value Ref Range Status   MRSA by PCR NEGATIVE NEGATIVE Final    Comment:        The GeneXpert MRSA Assay (FDA approved for NASAL specimens only), is one component of a comprehensive MRSA colonization surveillance program. It is not intended to diagnose MRSA infection nor to guide or monitor treatment for MRSA infections.     Radiology Reports No results found.   CBC  Recent Labs Lab 10/29/15 1949 10/30/15 0528  WBC 9.0 6.4  HGB 14.1 14.3  HCT 42.2 41.9  PLT 304 290  MCV 102.7* 103.0*  MCH 34.4* 35.1*   MCHC 33.5 34.1  RDW 14.5 14.2  LYMPHSABS 1.5  --   MONOABS 1.2*  --   EOSABS 0.0  --   BASOSABS 0.1  --     Chemistries   Recent Labs Lab 10/29/15 1949 10/30/15 0528  NA 141 138  K 4.5 4.2  CL 101 101  CO2 31 31  GLUCOSE 32* 278*  BUN 21* 17  CREATININE 0.88 0.69  CALCIUM 9.3 8.9  AST 31  --   ALT 27  --   ALKPHOS 68  --   BILITOT 0.4  --    ------------------------------------------------------------------------------------------------------------------ estimated creatinine clearance is 87.2 mL/min (by C-G formula based on SCr of 0.69 mg/dL). ------------------------------------------------------------------------------------------------------------------ No results for input(s): HGBA1C in the last 72 hours. ------------------------------------------------------------------------------------------------------------------ No results for input(s): CHOL, HDL, LDLCALC, TRIG, CHOLHDL, LDLDIRECT in the last 72 hours. ------------------------------------------------------------------------------------------------------------------ No results for input(s): TSH, T4TOTAL, T3FREE, THYROIDAB in the last 72 hours.  Invalid input(s): FREET3 ------------------------------------------------------------------------------------------------------------------ No results for input(s): VITAMINB12, FOLATE, FERRITIN, TIBC, IRON, RETICCTPCT in the last 72 hours.  Coagulation profile No results for input(s): INR, PROTIME in the last 168 hours.  No results for input(s): DDIMER in the last 72 hours.  Cardiac Enzymes No results for input(s): CKMB, TROPONINI, MYOGLOBIN in the last 168 hours.  Invalid input(s): CK ------------------------------------------------------------------------------------------------------------------ Invalid input(s): POCBNP    Assessment & Plan  Patient is a 50 year old who H has cognitive impairment    1. DMII with  hypoglycemia -  Now his blood sugars are  increasing I will restart him on his insulin as well as sliding scale   2. Hypotension continue IV fluids and supportive care  3.  Down syndrome - resume home medication    Code Status Orders        Start     Ordered   10/29/15 2308  Full code  Continuous     10/29/15 2307    Code Status History    Date Active Date Inactive Code Status Order ID Comments User Context   This patient has a current code status but no historical code status.           Consultsnon  DVT Prophylaxis  Lovenox    Lab Results  Component Value Date   PLT 290 10/30/2015     Time Spent in minutes    Greater than 50% of time spent in care coordination and counseling patient regarding the condition and plan of care.   Auburn Bilberry M.D on 10/30/2015 at 1:53 PM  Between 7am to 6pm - Pager - [phone]  After 6pm go to [email or web address] - password [email or web address] [facility]  [facility]   Office  [phone]

## 2015-10-30 NOTE — Clinical Social Work Note (Signed)
Clinical Social Work Assessment  Patient Details  Name: Martin Randolph MRN: [id number] Date of Birth: Jul 27, 1965  Date of referral:  10/30/15               Reason for consult:  Discharge Planning                Permission sought to share information with:  Family Supports Permission granted to share information::     Name::        Agency::     Relationship::   Marcello Moores([name]- Brother)  Contact Information:     Housing/Transportation Living arrangements for the past 2 months:  Group [facility] ([name]) Source of Information:  Facility [facility]([name]- Brother) Patient Interpreter Needed:  None Criminal Activity/Legal Involvement Pertinent to Current Situation/Hospitalization:  No - Comment as needed Significant Relationships:  Other Family Members, Parents Lives with:  [facility] Do you feel safe going back to the place where you live?  Yes Need for family participation in patient care:  Yes (Comment) Marcello Moores([name]- Brother)  Care giving concerns:  [name]    Social Worker assessment / plan:  CSW attempted to contact patient's brother Programme researcher, broadcasting/film/videosacc. No answer will try again at a later time. CSW spoke to [place]- RN with [name]. Per Lupita Leashonna patient has been a resident for them for a few months. Stated that she has concerns about patient's blood sugars. Stated that they are uncontrolled. Reported hat patient has also dropped weight significantly in the past few weeks from 121-108. Stated that she is able to accept patient back at discharge. FL2 has not been completed due to patient needing the discharge medications manually added at discharge.   Employment status:  Disabled (Comment on whether or not currently receiving Disability) Insurance information:  Medicare PT Recommendations:  Not assessed at this time Information / Referral to community resources:     Patient/Family's Response to care:  [facility].   Patient/Family's  Understanding of and Emotional Response to Diagnosis, Current Treatment, and Prognosis:  Unable to assess at this time.   Emotional Assessment Appearance:  Appears stated age Attitude/Demeanor/Rapport:  Unable to Assess Affect (typically observed):  Unable to Assess Orientation:   (Unable to Assess) Alcohol / Substance use:  Not Applicable Psych involvement (Current and /or in the community):  No (Comment)  Discharge Needs  Concerns to be addressed:  Discharge Planning Concerns Readmission within the last 30 days:  No Current discharge risk:  Chronically ill Barriers to Discharge:  Continued Medical Work up   WalgreenChristina E Michaeleen Down, LCSW 10/30/2015, 4:49 PM

## 2015-10-30 NOTE — Care Management Obs Status (Signed)
MEDICARE OBSERVATION STATUS NOTIFICATION   Patient Details  Name: Martin Randolph MRN: [id number] Date of Birth: January 23, 1966   Medicare Observation Status Notification Given:  Yes    Gwenette GreetBrenda S Andric Kerce, RN 10/30/2015, 3:16 PM

## 2015-10-31 ENCOUNTER — Observation Stay: Payer: Medicare (Managed Care)

## 2015-10-31 DIAGNOSIS — R569 Unspecified convulsions: Secondary | ICD-10-CM

## 2015-10-31 LAB — GLUCOSE, CAPILLARY
GLUCOSE-CAPILLARY: 115 mg/dL — AB (ref 65–99)
Glucose-Capillary: 104 mg/dL — ABNORMAL HIGH (ref 65–99)
Glucose-Capillary: 218 mg/dL — ABNORMAL HIGH (ref 65–99)
Glucose-Capillary: 313 mg/dL — ABNORMAL HIGH (ref 65–99)
Glucose-Capillary: 354 mg/dL — ABNORMAL HIGH (ref 65–99)
Glucose-Capillary: 85 mg/dL (ref 65–99)
Glucose-Capillary: 87 mg/dL (ref 65–99)

## 2015-10-31 LAB — VALPROIC ACID LEVEL: VALPROIC ACID LVL: 52 ug/mL (ref 50.0–100.0)

## 2015-10-31 MED ORDER — GLUCERNA SHAKE PO LIQD
237.0000 mL | Freq: Three times a day (TID) | ORAL | Status: DC
  Administered 2015-10-31 – 2015-11-03 (×9): 237 mL via ORAL

## 2015-10-31 MED ORDER — LEVETIRACETAM 500 MG PO TABS
500.0000 mg | ORAL_TABLET | Freq: Two times a day (BID) | ORAL | Status: DC
  Administered 2015-10-31 – 2015-11-03 (×7): 500 mg via ORAL
  Filled 2015-10-31 (×7): qty 1

## 2015-10-31 NOTE — Progress Notes (Signed)
Initial Nutrition Assessment  DOCUMENTATION CODES:   Severe malnutrition in context of chronic illness  INTERVENTION:  -Glucerna Shake po TID, each supplement provides 220 kcal and 10 grams of protein  NUTRITION DIAGNOSIS:   Malnutrition related to chronic illness as evidenced by severe depletion of muscle mass, severe depletion of body fat.  GOAL:   Patient will meet greater than or equal to 90% of their needs  MONITOR:   PO intake, I & O's, Labs, Weight trends, Supplement acceptance  REASON FOR ASSESSMENT:   Malnutrition Screening Tool    ASSESSMENT:   Martin Randolph  is a 50 y.o. male who presents with hypoglycemia. Patient has Down syndrome and dementia and lives in a group home. He is nonverbal at baseline and history is taken from the group home worker who is with him in the ED  Pt is non verbal, spoke with his caregiver at bedside. She endorses usual weight of between 120 and 140#, is unsure of recent weight loss. She states PO intake is good and that he "loves to eat."  Documented PO 75-100% prior to breakfast this morning. Nutrition-Focused physical exam completed. Findings are severe fat depletion, severe muscle depletion, and no edema.  Suspect patient's depletions are a result of lack of physical activity. Hypoglycemia has been corrected at this time, CBGs trending upwards from 104-218 Medications reviewed: Miralax/Glycolax  Diet Order:  Diet heart healthy/carb modified Room service appropriate? Yes; Fluid consistency: Thin  Skin:  Reviewed, no issues  Last BM:  9/26  Height:   Ht Readings from Last 1 Encounters:  10/29/15 5\' 3"  (1.6 m)    Weight:   Wt Readings from Last 1 Encounters:  10/29/15 123 lb 1.6 oz (55.8 kg)    Ideal Body Weight:  56.36 kg  BMI:  Body mass index is 21.81 kg/m.  Estimated Nutritional Needs:   Kcal:  1600-1900 calories  Protein:  55-70 gm  Fluid:  >/= 1.6L  EDUCATION NEEDS:   No education needs identified at this  time  Dionne AnoWilliam M. Heatherly Stenner, MS, RD LDN Inpatient Clinical Dietitian Pager [phone]

## 2015-10-31 NOTE — Progress Notes (Addendum)
Writer witnessed a 15 second seizure at shift change 0719am. No history of seizures noted. MD paged to notify. MD returned page at 0845 and notified. No new orders. Continue to monitor. Seizure pads in place. Primary RN, Maddy made aware. Bo McclintockBrewer,Tilton Marsalis S, RN

## 2015-10-31 NOTE — Plan of Care (Signed)
Problem: Safety: Goal: Ability to remain free from injury will improve Outcome: Not Progressing Safety sitter at bedside. Low bed and floor mats.

## 2015-10-31 NOTE — Progress Notes (Addendum)
[facility] [place] at [facility]                                                                                                                                                                                            Patient Demographics   Martin Randolph, is a 50 y.o. male, DOB - April 16, 1965, ZOX:[id number]  Admit date - 10/29/2015   Admitting Physician Oralia Manis, MD  Outpatient Primary MD for the patient is Pasty Spillers McLean-Scocuzza, MD   LOS - 0  Subjective: Patient had a episode of tonic-clonic seizure this morning. Witnessed by nurse. His blood sugars improved blood pressures improved   Review of Systems:   CONSTITUTIONAL: Unable to provide due to his cognitive impairment   Vitals:   Vitals:   10/30/15 1114 10/30/15 1349 10/30/15 2046 10/31/15 0849  BP: 97/67 (!) 112/46 92/61 113/83  Pulse: 66 97 86 76  Resp:   18   Temp:  98 F (36.7 C) 97.9 F (36.6 C) 97.7 F (36.5 C)  TempSrc:  Oral Oral Oral  SpO2: (!) 76% 100% 94% 98%  Weight:      Height:        Wt Readings from Last 3 Encounters:  10/29/15 123 lb 1.6 oz (55.8 kg)  10/18/15 113 lb (51.3 kg)     Intake/Output Summary (Last 24 hours) at 10/31/15 1131 Last data filed at 10/31/15 0800  Gross per 24 hour  Intake             1300 ml  Output                0 ml  Net             1300 ml    Physical Exam:   GENERAL: Pleasant-appearing in no apparent distress.  HEAD, EYES, EARS, NOSE AND THROAT: Atraumatic, normocephalic.  Pupils equal and reactive to light. Sclerae anicteric. No conjunctival injection. No oro-pharyngeal erythema.  NECK: Supple. There is no jugular venous distention. No bruits, no lymphadenopathy, no thyromegaly.  HEART: Regular rate and rhythm,. No murmurs, no rubs, no clicks.  LUNGS: Clear to auscultation bilaterally. No rales or rhonchi. No wheezes.  ABDOMEN: Soft, flat, nontender, nondistended. Has good bowel sounds. No hepatosplenomegaly appreciated.   EXTREMITIES: No evidence of any cyanosis, clubbing, or peripheral edema.  +2 pedal and radial pulses bilaterally.  NEUROLOGIC: Patient is awake but not oriented  SKIN: Moist and warm with no rashes appreciated.  Psych: Not anxious, depressed LN: No inguinal LN enlargement    Antibiotics   Anti-infectives    None  Medications   Scheduled Meds: . carbamide peroxide  5-10 drop Both Ears BID  . divalproex  250 mg Oral TID  . donepezil  10 mg Oral QHS  . enoxaparin (LOVENOX) injection  40 mg Subcutaneous QHS  . famotidine  20 mg Oral Daily  . feeding supplement (GLUCERNA SHAKE)  237 mL Oral TID BM  . gabapentin  400 mg Oral QHS  . insulin aspart  0-9 Units Subcutaneous TID WC  . insulin glargine  8 Units Subcutaneous QHS  . levETIRAcetam  500 mg Oral BID  . Melatonin  5 mg Oral QHS  . metFORMIN  1,000 mg Oral BID WC  . polyethylene glycol  17 g Oral Q3 days  . QUEtiapine  25 mg Oral QHS  . QUEtiapine  50 mg Oral BID  . simvastatin  10 mg Oral QHS  . sodium chloride flush  3 mL Intravenous Q12H  . tamsulosin  0.4 mg Oral QHS   Continuous Infusions:   PRN Meds:.acetaminophen **OR** acetaminophen, ondansetron **OR** ondansetron (ZOFRAN) IV, QUEtiapine   Data Review:   Micro Results Recent Results (from the past 240 hour(s))  MRSA PCR Screening     Status: None   Collection Time: 10/30/15  4:59 AM  Result Value Ref Range Status   MRSA by PCR NEGATIVE NEGATIVE Final    Comment:        The GeneXpert MRSA Assay (FDA approved for NASAL specimens only), is one component of a comprehensive MRSA colonization surveillance program. It is not intended to diagnose MRSA infection nor to guide or monitor treatment for MRSA infections.     Radiology Reports No results found.   CBC  Recent Labs Lab 10/29/15 1949 10/30/15 0528  WBC 9.0 6.4  HGB 14.1 14.3  HCT 42.2 41.9  PLT 304 290  MCV 102.7* 103.0*  MCH 34.4* 35.1*  MCHC 33.5 34.1  RDW 14.5 14.2   LYMPHSABS 1.5  --   MONOABS 1.2*  --   EOSABS 0.0  --   BASOSABS 0.1  --     Chemistries   Recent Labs Lab 10/29/15 1949 10/30/15 0528  NA 141 138  K 4.5 4.2  CL 101 101  CO2 31 31  GLUCOSE 32* 278*  BUN 21* 17  CREATININE 0.88 0.69  CALCIUM 9.3 8.9  AST 31  --   ALT 27  --   ALKPHOS 68  --   BILITOT 0.4  --    ------------------------------------------------------------------------------------------------------------------ estimated creatinine clearance is 87.2 mL/min (by C-G formula based on SCr of 0.69 mg/dL). ------------------------------------------------------------------------------------------------------------------ No results for input(s): HGBA1C in the last 72 hours. ------------------------------------------------------------------------------------------------------------------ No results for input(s): CHOL, HDL, LDLCALC, TRIG, CHOLHDL, LDLDIRECT in the last 72 hours. ------------------------------------------------------------------------------------------------------------------ No results for input(s): TSH, T4TOTAL, T3FREE, THYROIDAB in the last 72 hours.  Invalid input(s): FREET3 ------------------------------------------------------------------------------------------------------------------ No results for input(s): VITAMINB12, FOLATE, FERRITIN, TIBC, IRON, RETICCTPCT in the last 72 hours.  Coagulation profile No results for input(s): INR, PROTIME in the last 168 hours.  No results for input(s): DDIMER in the last 72 hours.  Cardiac Enzymes No results for input(s): CKMB, TROPONINI, MYOGLOBIN in the last 168 hours.  Invalid input(s): CK ------------------------------------------------------------------------------------------------------------------ Invalid input(s): POCBNP    Assessment & Plan  Patient is a 50 year old who H has cognitive impairment    1. Seizure Apparently patient had 3 seizures at the facility I will start him on  Keppra I will asked neurology to see  2.  DMII with  hypoglycemia -  Now  his blood sugars stable but labile   2. Hypotension now resolved   3.  Down syndrome - resume home medication    Code Status Orders        Start     Ordered   10/29/15 2308  Full code  Continuous     10/29/15 2307    Code Status History    Date Active Date Inactive Code Status Order ID Comments User Context   This patient has a current code status but no historical code status.           Consultsnon  DVT Prophylaxis  Lovenox    Lab Results  Component Value Date   PLT 290 10/30/2015     Time Spent in minutes    Greater than 50% of time spent in care coordination and counseling patient regarding the condition and plan of care.   Auburn Bilberry M.D on 10/31/2015 at 11:31 AM  Between 7am to 6pm - Pager - [phone]  After 6pm go to [email or web address] - password [email or web address] [facility]  [facility] [place] [facility]   Office  [phone]

## 2015-10-31 NOTE — Consult Note (Signed)
Reason for Consult:Seizure Referring Physician: Allena KatzPatel  CC: Seizure  HPI: Martin Randolph is an 50 y.o. male who is unable to provide any history.  Family not present therefore all history obtained from the chart.  Patient admitted on 9/25 with hypoglycemia.  Was scheduled for discharge today but was noted by nursing to have a 15 second seizure.  Patient without a history of seizures.  Patient on Depakote and Neurontin.     Past Medical History:  Diagnosis Date  . Alzheimer's dementia   . Diabetes (HCC)   . Down syndrome     Past Surgical History:  Procedure Laterality Date  . DENTAL SURGERY      History reviewed. No pertinent family history.  Social History:  reports that he has never smoked. He has never used smokeless tobacco. He reports that he does not drink alcohol or use drugs.  No Known Allergies  Medications:  I have reviewed the patient's current medications. Prior to Admission:  Prescriptions Prior to Admission  Medication Sig Dispense Refill Last Dose  . acetaminophen (TYLENOL) 325 MG tablet Take 650 mg by mouth every 6 (six) hours as needed for mild pain.   prn at prn  . carbamide peroxide (DEBROX) 6.5 % otic solution Place 5-10 drops into both ears 2 (two) times daily.   10/29/2015 at Unknown time  . cetaphil (CETAPHIL) lotion Apply 1 application topically 2 (two) times daily.   10/29/2015 at Unknown time  . divalproex (DEPAKOTE SPRINKLE) 125 MG capsule Take 250 mg by mouth 3 (three) times daily.   10/29/2015 at Unknown time  . donepezil (ARICEPT) 10 MG tablet Take 10 mg by mouth at bedtime.   Past Week at Unknown time  . famotidine (PEPCID) 20 MG tablet Take 20 mg by mouth daily.   10/28/2015 at Unknown time  . feeding supplement, GLUCERNA SHAKE, (GLUCERNA SHAKE) LIQD Take 237 mLs by mouth 3 (three) times daily between meals.   10/29/2015 at Unknown time  . gabapentin (NEURONTIN) 400 MG capsule Take 400 mg by mouth at bedtime.   10/28/2015 at Unknown time  . insulin  glargine (LANTUS) 100 UNIT/ML injection Inject 12 Units into the skin at bedtime.   10/28/2015 at Unknown time  . insulin lispro (HUMALOG) 100 UNIT/ML injection Inject 0-10 Units into the skin 3 (three) times daily after meals.   10/29/2015 at Unknown time  . Melatonin 3 MG TABS Take 3 mg by mouth at bedtime.   10/28/2015 at Unknown time  . metFORMIN (GLUCOPHAGE) 1000 MG tablet Take 1,000 mg by mouth 2 (two) times daily with a meal.   10/28/2015 at Unknown time  . polyethylene glycol (MIRALAX / GLYCOLAX) packet Take 17 g by mouth every 3 (three) days.   Past Month at Unknown time  . QUEtiapine (SEROQUEL) 25 MG tablet Take 25 mg by mouth at bedtime.   Past Week at Unknown time  . QUEtiapine (SEROQUEL) 25 MG tablet Take 25 mg by mouth 2 (two) times daily as needed (agitation).   prn at prn  . QUEtiapine (SEROQUEL) 50 MG tablet Take 50 mg by mouth 2 (two) times daily.   10/29/2015 at Unknown time  . simvastatin (ZOCOR) 10 MG tablet Take 10 mg by mouth at bedtime.   10/28/2015 at Unknown time  . tamsulosin (FLOMAX) 0.4 MG CAPS capsule Take 0.4 mg by mouth at bedtime.   10/28/2015 at Unknown time    ROS: Unable to obtain due to mental status  Physical Examination: Blood pressure Marland Kitchen(!)  91/48, pulse (!) 57, temperature 97.6 F (36.4 C), temperature source Oral, resp. rate 18, height 5\' 3"  (1.6 m), weight 55.8 kg (123 lb 1.6 oz), SpO2 100 %.  HEENT-  Normocephalic, no lesions, without obvious abnormality.  Normal external eye and conjunctiva.  Normal TM's bilaterally.  Normal auditory canals and external ears. Normal external nose, mucus membranes and septum.  Normal pharynx. Cardiovascular- S1, S2 normal, pulses palpable throughout   Lungs- chest clear, no wheezing, rales, normal symmetric air entry Abdomen- soft, non-tender; bowel sounds normal; no masses,  no organomegaly Extremities- no edema Lymph-no adenopathy palpable Musculoskeletal-no joint tenderness, deformity or swelling Skin-flat, erythematous  skin lesions noted  Neurological Examination Mental Status: Lethargic.  Opens eyes with deep sternal rub and thrashes.  No speech.  Does not follow commands.   Cranial Nerves: II: Discs flat bilaterally; Does not blink to bilateral confrontation.  Pupils pinpoint and unreactive. III,IV, VI: Oculocephalic maneuvers intact V,VII: intact corneals VIII: unable to test IX,X: unable to test XI: unable to test XII: unable to test Motor: Moves all extremities with excessive stimulation but can not formally evaluate Sensory: Responds to noxious stimulation throughout Deep Tendon Reflexes: 1+ and symmetric with absent AJ's bilaterally Plantars: Right: upgoing   Left: upgoing Cerebellar: unable to test  Gait: unable to test due to safety concerns   Laboratory Studies:   Basic Metabolic Panel:  Recent Labs Lab 10/29/15 1949 10/30/15 0528  NA 141 138  K 4.5 4.2  CL 101 101  CO2 31 31  GLUCOSE 32* 278*  BUN 21* 17  CREATININE 0.88 0.69  CALCIUM 9.3 8.9    Liver Function Tests:  Recent Labs Lab 10/29/15 1949  AST 31  ALT 27  ALKPHOS 68  BILITOT 0.4  PROT 6.9  ALBUMIN 3.8    Recent Labs Lab 10/29/15 1949  LIPASE 16   No results for input(s): AMMONIA in the last 168 hours.  CBC:  Recent Labs Lab 10/29/15 1949 10/30/15 0528  WBC 9.0 6.4  NEUTROABS 6.2  --   HGB 14.1 14.3  HCT 42.2 41.9  MCV 102.7* 103.0*  PLT 304 290    Cardiac Enzymes: No results for input(s): CKTOTAL, CKMB, CKMBINDEX, TROPONINI in the last 168 hours.  BNP: Invalid input(s): POCBNP  CBG:  Recent Labs Lab 10/31/15 0059 10/31/15 0429 10/31/15 0741 10/31/15 0958 10/31/15 1159  GLUCAP 87 85 104* 218* 354*    Microbiology: Results for orders placed or performed during the hospital encounter of 10/29/15  MRSA PCR Screening     Status: None   Collection Time: 10/30/15  4:59 AM  Result Value Ref Range Status   MRSA by PCR NEGATIVE NEGATIVE Final    Comment:        The  GeneXpert MRSA Assay (FDA approved for NASAL specimens only), is one component of a comprehensive MRSA colonization surveillance program. It is not intended to diagnose MRSA infection nor to guide or monitor treatment for MRSA infections.     Coagulation Studies: No results for input(s): LABPROT, INR in the last 72 hours.  Urinalysis:  Recent Labs Lab 10/29/15 1950  COLORURINE YELLOW*  LABSPEC 1.026  PHURINE 5.0  GLUCOSEU >500*  HGBUR NEGATIVE  BILIRUBINUR NEGATIVE  KETONESUR 1+*  PROTEINUR 30*  NITRITE NEGATIVE  LEUKOCYTESUR NEGATIVE    Lipid Panel:  No results found for: CHOL, TRIG, HDL, CHOLHDL, VLDL, LDLCALC  HgbA1C: No results found for: HGBA1C  Urine Drug Screen:  No results found for: LABOPIA, COCAINSCRNUR, LABBENZ, AMPHETMU,  THCU, LABBARB  Alcohol Level: No results for input(s): ETH in the last 168 hours.  Imaging: Ct Head Wo Contrast  Result Date: 10/31/2015 CLINICAL DATA:  Tonic clonic seizure this morning. Pt was scanned in a left lateral position. Pt was moving around on the table between the topogram and th scan. EXAM: CT HEAD WITHOUT CONTRAST TECHNIQUE: Contiguous axial images were obtained from the base of the skull through the vertex without intravenous contrast. COMPARISON:  None. FINDINGS: Brain: The ventricles are enlarged, along with the sulci, consistent with advanced atrophy. No convincing hydrocephalus. There is white matter hypoattenuation with relative cortical thinning along the anterior frontal lobes without a convincing infarct. This could be related to old trauma. There are no parenchymal masses or mass effect. There is no evidence of a recent cortical infarct. There is a sliver of hypo attenuating fluid along the right cerebrum, higher attenuation than CSF. This is consistent with a small chronic subdural hemorrhage, measuring a maximum of approximately 5 mm in thickness. There is no significant mass effect or midline shift. There is no acute  intracranial hemorrhage. Vascular: No hyperdense vessel or unexpected calcification. Skull: Normal. Negative for fracture or focal lesion. Sinuses/Orbits: No acute finding. Other: None IMPRESSION: 1. 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 2. No acute intracranial abnormalities. No evidence of a recent infarct or of intracranial hemorrhage. 3. Advanced atrophy for age. Electronically Signed   By: Amie Portland M.D.   On: 10/31/2015 14:43     Assessment/Plan: 50 year old male with a history of Alzheimer's and Down's with a witnessed seizure today.  No history of seizures.  Admitted with hypoglycemia but no evidence that this played into today's seizure.  Alzheimer's does present a risk factor.  Head CT personally reviewed and shows no acute changes.  Patient currently on Depakote and Neurontin.    Recommendations: 1.  Depakote level.  Based on level will make adjustments in dosing.   2.  EEG 3.  Seizure precautions 4.  Ativan prn seizures  Thana Farr, MD Neurology [phone] 10/31/2015, 4:57 PM

## 2015-11-01 DIAGNOSIS — Z23 Encounter for immunization: Secondary | ICD-10-CM | POA: Diagnosis not present

## 2015-11-01 DIAGNOSIS — R569 Unspecified convulsions: Secondary | ICD-10-CM | POA: Diagnosis not present

## 2015-11-01 DIAGNOSIS — G4089 Other seizures: Secondary | ICD-10-CM | POA: Diagnosis not present

## 2015-11-01 DIAGNOSIS — Q909 Down syndrome, unspecified: Secondary | ICD-10-CM | POA: Diagnosis present

## 2015-11-01 DIAGNOSIS — Z79899 Other long term (current) drug therapy: Secondary | ICD-10-CM | POA: Diagnosis not present

## 2015-11-01 DIAGNOSIS — F0280 Dementia in other diseases classified elsewhere without behavioral disturbance: Secondary | ICD-10-CM | POA: Diagnosis present

## 2015-11-01 DIAGNOSIS — E11649 Type 2 diabetes mellitus with hypoglycemia without coma: Secondary | ICD-10-CM | POA: Diagnosis present

## 2015-11-01 DIAGNOSIS — Z794 Long term (current) use of insulin: Secondary | ICD-10-CM | POA: Diagnosis not present

## 2015-11-01 DIAGNOSIS — I959 Hypotension, unspecified: Secondary | ICD-10-CM | POA: Diagnosis not present

## 2015-11-01 LAB — GLUCOSE, CAPILLARY
GLUCOSE-CAPILLARY: 445 mg/dL — AB (ref 65–99)
GLUCOSE-CAPILLARY: 143 mg/dL — AB (ref 65–99)
GLUCOSE-CAPILLARY: 410 mg/dL — AB (ref 65–99)
GLUCOSE-CAPILLARY: 302 mg/dL — AB (ref 65–99)
GLUCOSE-CAPILLARY: 79 mg/dL (ref 65–99)
Glucose-Capillary: 156 mg/dL — ABNORMAL HIGH (ref 65–99)
Glucose-Capillary: 199 mg/dL — ABNORMAL HIGH (ref 65–99)
Glucose-Capillary: 30 mg/dL — CL (ref 65–99)
Glucose-Capillary: 35 mg/dL — CL (ref 65–99)
Glucose-Capillary: 462 mg/dL — ABNORMAL HIGH (ref 65–99)
Glucose-Capillary: 475 mg/dL — ABNORMAL HIGH (ref 65–99)
Glucose-Capillary: 488 mg/dL — ABNORMAL HIGH (ref 65–99)

## 2015-11-01 LAB — CORTISOL: CORTISOL PLASMA: 7.2 ug/dL

## 2015-11-01 MED ORDER — INSULIN ASPART 100 UNIT/ML ~~LOC~~ SOLN
12.0000 [IU] | Freq: Once | SUBCUTANEOUS | Status: AC
  Administered 2015-11-01: 02:00:00 12 [IU] via SUBCUTANEOUS
  Filled 2015-11-01: qty 12

## 2015-11-01 MED ORDER — QUETIAPINE FUMARATE 25 MG PO TABS: 25.0000 mg | ORAL_TABLET | Freq: Two times a day (BID) | ORAL | Status: DC | PRN

## 2015-11-01 MED ORDER — INSULIN GLARGINE 100 UNIT/ML ~~LOC~~ SOLN
12.0000 [IU] | Freq: Every day | SUBCUTANEOUS | Status: DC
  Administered 2015-11-01: 12 [IU] via SUBCUTANEOUS
  Filled 2015-11-01 (×2): qty 0.12

## 2015-11-01 MED ORDER — INSULIN ASPART 100 UNIT/ML ~~LOC~~ SOLN
10.0000 [IU] | Freq: Once | SUBCUTANEOUS | Status: AC
  Administered 2015-11-01: 03:00:00 10 [IU] via SUBCUTANEOUS
  Filled 2015-11-01: qty 10

## 2015-11-01 NOTE — Progress Notes (Signed)
Subjective: Patient has eaten well today.  Currently resting.  Take off Depakote since per report of mother this has been related to labile blood sugars in the past.  Has been started on Keppra.  Seems to be tolerating well.    Objective: Current vital signs: BP (!) 76/42 (BP Location: Left Arm)   Pulse 61   Temp 97.5 F (36.4 C) (Oral)   Resp 18   Ht 5\' 3"  (1.6 m)   Wt 55.8 kg (123 lb 1.6 oz)   SpO2 90%   BMI 21.81 kg/m  Vital signs in last 24 hours: Temp:  [97.2 F (36.2 C)-97.6 F (36.4 C)] 97.5 F (36.4 C) (09/28 0815) Pulse Rate:  [57-70] 61 (09/28 0815) Resp:  [18] 18 (09/28 0442) BP: (76-110)/(42-70) 76/42 (09/28 0815) SpO2:  [90 %-100 %] 90 % (09/28 0815)  Intake/Output from previous day: 09/27 0701 - 09/28 0700 In: 840 [P.O.:840] Out: -  Intake/Output this shift: Total I/O In: 120 [P.O.:120] Out: -  Nutritional status: Diet heart healthy/carb modified Room service appropriate? Yes; Fluid consistency: Thin  Neurologic Exam: Lethargic.  Opens eyes with deep sternal rub and thrashes.  No speech.  Does not follow commands.   Cranial Nerves: II: Discs flat bilaterally; Does not blink to bilateral confrontation.  Pupils pinpoint and unreactive. III,IV, VI: Oculocephalic maneuvers intact V,VII: intact corneals VIII: unable to test IX,X: unable to test XI: unable to test XII: unable to test Motor: Moves all extremities with excessive stimulation but can not formally evaluate  Lab Results: Basic Metabolic Panel:  Recent Labs Lab 10/29/15 1949 10/30/15 0528  NA 141 138  K 4.5 4.2  CL 101 101  CO2 31 31  GLUCOSE 32* 278*  BUN 21* 17  CREATININE 0.88 0.69  CALCIUM 9.3 8.9    Liver Function Tests:  Recent Labs Lab 10/29/15 1949  AST 31  ALT 27  ALKPHOS 68  BILITOT 0.4  PROT 6.9  ALBUMIN 3.8    Recent Labs Lab 10/29/15 1949  LIPASE 16   No results for input(s): AMMONIA in the last 168 hours.  CBC:  Recent Labs Lab 10/29/15 1949  10/30/15 0528  WBC 9.0 6.4  NEUTROABS 6.2  --   HGB 14.1 14.3  HCT 42.2 41.9  MCV 102.7* 103.0*  PLT 304 290    Cardiac Enzymes: No results for input(s): CKTOTAL, CKMB, CKMBINDEX, TROPONINI in the last 168 hours.  Lipid Panel: No results for input(s): CHOL, TRIG, HDL, CHOLHDL, VLDL, LDLCALC in the last 168 hours.  CBG:  Recent Labs Lab 11/01/15 0422 11/01/15 0804 11/01/15 0808 11/01/15 0908 11/01/15 1211  GLUCAP 156* 30* 35* 143* 410*    Microbiology: Results for orders placed or performed during the hospital encounter of 10/29/15  MRSA PCR Screening     Status: None   Collection Time: 10/30/15  4:59 AM  Result Value Ref Range Status   MRSA by PCR NEGATIVE NEGATIVE Final    Comment:        The GeneXpert MRSA Assay (FDA approved for NASAL specimens only), is one component of a comprehensive MRSA colonization surveillance program. It is not intended to diagnose MRSA infection nor to guide or monitor treatment for MRSA infections.     Coagulation Studies: No results for input(s): LABPROT, INR in the last 72 hours.  Imaging: Ct Head Wo Contrast  Result Date: 10/31/2015 CLINICAL DATA:  Tonic clonic seizure this morning. Pt was scanned in a left lateral position. Pt was moving around on  the table between the topogram and th scan. EXAM: CT HEAD WITHOUT CONTRAST TECHNIQUE: Contiguous axial images were obtained from the base of the skull through the vertex without intravenous contrast. COMPARISON:  None. FINDINGS: Brain: The ventricles are enlarged, along with the sulci, consistent with advanced atrophy. No convincing hydrocephalus. There is white matter hypoattenuation with relative cortical thinning along the anterior frontal lobes without a convincing infarct. This could be related to old trauma. There are no parenchymal masses or mass effect. There is no evidence of a recent cortical infarct. There is a sliver of hypo attenuating fluid along the right cerebrum, higher  attenuation than CSF. This is consistent with a small chronic subdural hemorrhage, measuring a maximum of approximately 5 mm in thickness. There is no significant mass effect or midline shift. There is no acute intracranial hemorrhage. Vascular: No hyperdense vessel or unexpected calcification. Skull: Normal. Negative for fracture or focal lesion. Sinuses/Orbits: No acute finding. Other: None IMPRESSION: 1. 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 2. No acute intracranial abnormalities. No evidence of a recent infarct or of intracranial hemorrhage. 3. Advanced atrophy for age. Electronically Signed   By: Amie Portland M.D.   On: 10/31/2015 14:43    Medications:  I have reviewed the patient's current medications. Scheduled: . carbamide peroxide  5-10 drop Both Ears BID  . donepezil  10 mg Oral QHS  . enoxaparin (LOVENOX) injection  40 mg Subcutaneous QHS  . famotidine  20 mg Oral Daily  . feeding supplement (GLUCERNA SHAKE)  237 mL Oral TID BM  . gabapentin  400 mg Oral QHS  . insulin aspart  0-9 Units Subcutaneous TID WC  . insulin glargine  12 Units Subcutaneous QHS  . levETIRAcetam  500 mg Oral BID  . Melatonin  5 mg Oral QHS  . metFORMIN  1,000 mg Oral BID WC  . polyethylene glycol  17 g Oral Q3 days  . QUEtiapine  25 mg Oral QHS  . QUEtiapine  50 mg Oral BID  . simvastatin  10 mg Oral QHS  . sodium chloride flush  3 mL Intravenous Q12H  . tamsulosin  0.4 mg Oral QHS    Assessment/Plan: Patient on Keppra and Neurontin.  No further seizure activity noted.  EEG shows normal sleep.    Recommendations: 1.  Continue Keppra and Neurontin at current doses.     LOS: 0 days   Thana Farr, MD Neurology [phone] 11/01/2015  2:09 PM

## 2015-11-01 NOTE — Progress Notes (Signed)
[facility] [place] at [facility]                                                                                                                                                                                            Patient Demographics   Martin Randolph, is a 50 y.o. male, DOB - 09-21-1965, RUE:[id number]  Admit date - 10/29/2015   Admitting Physician Oralia Manis, MD  Outpatient Primary MD for the patient is Pasty Spillers McLean-Scocuzza, MD   LOS - 0  Subjective: No further seizure activity noted patient's blood glucose again very labile dropping in the 30s and then going up into the 400 Review of Systems:   CONSTITUTIONAL: Unable to provide due to his cognitive impairment   Vitals:   Vitals:   10/31/15 1654 10/31/15 1714 11/01/15 0442 11/01/15 0815  BP: (!) 91/48 110/70 (!) 95/45 (!) 76/42  Pulse: (!) 57  66 61  Resp: 18  18   Temp:   97.2 F (36.2 C) 97.5 F (36.4 C)  TempSrc:    Oral  SpO2: 100%  98% 90%  Weight:      Height:        Wt Readings from Last 3 Encounters:  10/29/15 123 lb 1.6 oz (55.8 kg)  10/18/15 113 lb (51.3 kg)     Intake/Output Summary (Last 24 hours) at 11/01/15 1332 Last data filed at 11/01/15 1004  Gross per 24 hour  Intake              600 ml  Output                0 ml  Net              600 ml    Physical Exam:   GENERAL: Pleasant-appearing in no apparent distress.  HEAD, EYES, EARS, NOSE AND THROAT: Atraumatic, normocephalic.  Pupils equal and reactive to light. Sclerae anicteric. No conjunctival injection. No oro-pharyngeal erythema.  NECK: Supple. There is no jugular venous distention. No bruits, no lymphadenopathy, no thyromegaly.  HEART: Regular rate and rhythm,. No murmurs, no rubs, no clicks.  LUNGS: Clear to auscultation bilaterally. No rales or rhonchi. No wheezes.  ABDOMEN: Soft, flat, nontender, nondistended. Has good bowel sounds. No hepatosplenomegaly appreciated.  EXTREMITIES: No evidence of any  cyanosis, clubbing, or peripheral edema.  +2 pedal and radial pulses bilaterally.  NEUROLOGIC: Not oriented to place person or time  SKIN: Moist and warm with no rashes appreciated.  Psych: Not anxious, depressed LN: No inguinal LN enlargement    Antibiotics   Anti-infectives    None  Medications   Scheduled Meds: . carbamide peroxide  5-10 drop Both Ears BID  . donepezil  10 mg Oral QHS  . enoxaparin (LOVENOX) injection  40 mg Subcutaneous QHS  . famotidine  20 mg Oral Daily  . feeding supplement (GLUCERNA SHAKE)  237 mL Oral TID BM  . gabapentin  400 mg Oral QHS  . insulin aspart  0-9 Units Subcutaneous TID WC  . insulin glargine  12 Units Subcutaneous QHS  . levETIRAcetam  500 mg Oral BID  . Melatonin  5 mg Oral QHS  . metFORMIN  1,000 mg Oral BID WC  . polyethylene glycol  17 g Oral Q3 days  . QUEtiapine  25 mg Oral QHS  . QUEtiapine  50 mg Oral BID  . simvastatin  10 mg Oral QHS  . sodium chloride flush  3 mL Intravenous Q12H  . tamsulosin  0.4 mg Oral QHS   Continuous Infusions:   PRN Meds:.acetaminophen **OR** acetaminophen, ondansetron **OR** ondansetron (ZOFRAN) IV, QUEtiapine, QUEtiapine   Data Review:   Micro Results Recent Results (from the past 240 hour(s))  MRSA PCR Screening     Status: None   Collection Time: 10/30/15  4:59 AM  Result Value Ref Range Status   MRSA by PCR NEGATIVE NEGATIVE Final    Comment:        The GeneXpert MRSA Assay (FDA approved for NASAL specimens only), is one component of a comprehensive MRSA colonization surveillance program. It is not intended to diagnose MRSA infection nor to guide or monitor treatment for MRSA infections.     Radiology Reports Ct Head Wo Contrast  Result Date: 10/31/2015 CLINICAL DATA:  Tonic clonic seizure this morning. Pt was scanned in a left lateral position. Pt was moving around on the table between the topogram and th scan. EXAM: CT HEAD WITHOUT CONTRAST TECHNIQUE: Contiguous axial  images were obtained from the base of the skull through the vertex without intravenous contrast. COMPARISON:  None. FINDINGS: Brain: The ventricles are enlarged, along with the sulci, consistent with advanced atrophy. No convincing hydrocephalus. There is white matter hypoattenuation with relative cortical thinning along the anterior frontal lobes without a convincing infarct. This could be related to old trauma. There are no parenchymal masses or mass effect. There is no evidence of a recent cortical infarct. There is a sliver of hypo attenuating fluid along the right cerebrum, higher attenuation than CSF. This is consistent with a small chronic subdural hemorrhage, measuring a maximum of approximately 5 mm in thickness. There is no significant mass effect or midline shift. There is no acute intracranial hemorrhage. Vascular: No hyperdense vessel or unexpected calcification. Skull: Normal. Negative for fracture or focal lesion. Sinuses/Orbits: No acute finding. Other: None IMPRESSION: 1. 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 2. No acute intracranial abnormalities. No evidence of a recent infarct or of intracranial hemorrhage. 3. Advanced atrophy for age. Electronically Signed   By: Amie Portland M.D.   On: 10/31/2015 14:43     CBC  Recent Labs Lab 10/29/15 1949 10/30/15 0528  WBC 9.0 6.4  HGB 14.1 14.3  HCT 42.2 41.9  PLT 304 290  MCV 102.7* 103.0*  MCH 34.4* 35.1*  MCHC 33.5 34.1  RDW 14.5 14.2  LYMPHSABS 1.5  --   MONOABS 1.2*  --   EOSABS 0.0  --   BASOSABS 0.1  --     Chemistries   Recent Labs Lab 10/29/15 1949 10/30/15 0528  NA 141 138  K 4.5 4.2  CL 101 101  CO2 31 31  GLUCOSE 32* 278*  BUN 21* 17  CREATININE 0.88 0.69  CALCIUM 9.3 8.9  AST 31  --   ALT 27  --   ALKPHOS 68  --   BILITOT 0.4  --    ------------------------------------------------------------------------------------------------------------------ estimated  creatinine clearance is 87.2 mL/min (by C-G formula based on SCr of 0.69 mg/dL). ------------------------------------------------------------------------------------------------------------------ No results for input(s): HGBA1C in the last 72 hours. ------------------------------------------------------------------------------------------------------------------ No results for input(s): CHOL, HDL, LDLCALC, TRIG, CHOLHDL, LDLDIRECT in the last 72 hours. ------------------------------------------------------------------------------------------------------------------ No results for input(s): TSH, T4TOTAL, T3FREE, THYROIDAB in the last 72 hours.  Invalid input(s): FREET3 ------------------------------------------------------------------------------------------------------------------ No results for input(s): VITAMINB12, FOLATE, FERRITIN, TIBC, IRON, RETICCTPCT in the last 72 hours.  Coagulation profile No results for input(s): INR, PROTIME in the last 168 hours.  No results for input(s): DDIMER in the last 72 hours.  Cardiac Enzymes No results for input(s): CKMB, TROPONINI, MYOGLOBIN in the last 168 hours.  Invalid input(s): CK ------------------------------------------------------------------------------------------------------------------ Invalid input(s): POCBNP    Assessment & Plan  Patient is a 50 year old who H has cognitive impairment    1. Seizure Apparently patient had 3 seizures at the facility Patient normally on Depakote however Depakote has caused him to have hypoglycemia in the past I will stop Depakote continue Keppra  2.  DMII with  hypoglycemia - Blood sugars are dropping again and elevated.  Diabetic nursing recommendations seen at this point with his persistent hypoglycemia I will not add pre-meal insulin but I will increase his Lantus continue sliding scale I will also check a cortisol level  3.  Hypotension now resolved   3.  Down syndrome - resume home  medication    Code Status Orders        Start     Ordered   10/29/15 2308  Full code  Continuous     10/29/15 2307    Code Status History    Date Active Date Inactive Code Status Order ID Comments User Context   This patient has a current code status but no historical code status.           Consultsnon  DVT Prophylaxis  Lovenox    Lab Results  Component Value Date   PLT 290 10/30/2015     Time Spent in minutes   32min Updated his mother  Auburn BilberryEL, Laural Eiland M.D on 11/01/2015 at 1:32 PM  Between 7am to 6pm - Pager - [phone]  After 6pm go to [email or web address] - password [email or web address] [facility]  [facility]   Office  [phone]

## 2015-11-01 NOTE — Progress Notes (Signed)
Inpatient Diabetes Program Recommendations  AACE/ADA: New Consensus Statement on Inpatient Glycemic Control (2015)  Target Ranges:  Prepandial:   less than 140 mg/dL      Peak postprandial:   less than 180 mg/dL (1-2 hours)      Critically ill patients:  140 - 180 mg/dL  Results for Martin PartridgeBONILLA, Martin (MRN [id number]) as of 11/01/2015 09:18  Ref. Range 10/31/2015 07:41 10/31/2015 09:58 10/31/2015 11:59 10/31/2015 16:58 10/31/2015 20:37 11/01/2015 01:39 11/01/2015 01:43 11/01/2015 02:29 11/01/2015 02:32 11/01/2015 04:22 11/01/2015 08:04 11/01/2015 08:08 11/01/2015 09:08  Glucose-Capillary Latest Ref Range: 65 - 99 mg/dL 409104 (H) 811218 (H)  Novolog 4 units 354 (H)  Novolog 9 untis 115 (H) 313 (H)  Lantus 8 units @ 20:51 475 (H) 445 (H)  Novolog 12 units @ 2:01 462 (H) 488 (H)  Novolog 10 units @ 3:00 156 (H) 30 (LL) 35 (LL) 143 (H)    Review of Glycemic Control  Outpatient Diabetes medications: Lantus 12 units QHS, Humalog 0-10 units TID with meals, Metformin 1000 mg BID Current orders for Inpatient glycemic control: Lantus 8 units QHS, Novolog 0-9 units TID with meals, Metformin 1000 mg BID  Inpatient Diabetes Program Recommendations: Insulin - Basal: Please increase Lantus to 12 units QHS. Correction (SSI): Noted fasting glucose 30 mg/dl this morning at 9:148:04 am. Patient recieved Novolog 12 units at 2:01 am on 9/28 and Novolog 10 units at 3:00 am on 9/28 (received a total of Novolog 22 units within 1 hour). Novolog correction should NOT be given any closer than Q4H due to duration of Novolog insulin which is 4-5 hours. Please add Novolog bedtime correction scale. Insulin - Meal Coverage: Please consider ordering Novolog 4 units TID with meals if patient eats at least 50% of meals. Outpatient Referral: Recommend patient be advised to follow up as an outpatient with an Endocrinologist for assistance with improving glycemic control.  Thanks, Orlando PennerMarie Jermell Holeman, RN, MSN, CDE Diabetes Coordinator Inpatient Diabetes  Program [phone] (Team Pager from 8am to 5pm) [phone] ([facility] office) [phone] [facility] office) [phone] [facility] office)

## 2015-11-01 NOTE — Plan of Care (Signed)
Problem: Safety: Goal: Ability to remain free from injury will improve Outcome: Progressing Remains on seizure precautions. Bed padded. Floor mats in place. 1:1 safety sitter at bedside.

## 2015-11-02 LAB — BASIC METABOLIC PANEL WITH GFR
Anion gap: 7 (ref 5–15)
BUN: 18 mg/dL (ref 6–20)
CO2: 29 mmol/L (ref 22–32)
Calcium: 8.6 mg/dL — ABNORMAL LOW (ref 8.9–10.3)
Chloride: 98 mmol/L — ABNORMAL LOW (ref 101–111)
Creatinine, Ser: 0.89 mg/dL (ref 0.61–1.24)
GFR calc Af Amer: >60 mL/min
GFR calc non Af Amer: >60 mL/min
Glucose, Bld: 347 mg/dL — ABNORMAL HIGH (ref 65–99)
Potassium: 4.4 mmol/L (ref 3.5–5.1)
Sodium: 134 mmol/L — ABNORMAL LOW (ref 135–145)

## 2015-11-02 LAB — GLUCOSE, CAPILLARY
GLUCOSE-CAPILLARY: 387 mg/dL — AB (ref 65–99)
Glucose-Capillary: 339 mg/dL — ABNORMAL HIGH (ref 65–99)
Glucose-Capillary: 469 mg/dL — ABNORMAL HIGH (ref 65–99)
Glucose-Capillary: 125 mg/dL — ABNORMAL HIGH (ref 65–99)
Glucose-Capillary: 92 mg/dL (ref 65–99)

## 2015-11-02 MED ORDER — INSULIN GLARGINE 100 UNIT/ML ~~LOC~~ SOLN
15.0000 [IU] | Freq: Every day | SUBCUTANEOUS | Status: DC
  Administered 2015-11-02: 15 [IU] via SUBCUTANEOUS
  Filled 2015-11-02 (×2): qty 0.15

## 2015-11-02 MED ORDER — INSULIN ASPART 100 UNIT/ML ~~LOC~~ SOLN
3.0000 [IU] | Freq: Three times a day (TID) | SUBCUTANEOUS | Status: DC
  Administered 2015-11-02: 3 [IU] via SUBCUTANEOUS
  Filled 2015-11-02: qty 3

## 2015-11-02 MED ORDER — INSULIN ASPART 100 UNIT/ML ~~LOC~~ SOLN
5.0000 [IU] | Freq: Three times a day (TID) | SUBCUTANEOUS | Status: DC
  Administered 2015-11-02 – 2015-11-03 (×2): 5 [IU] via SUBCUTANEOUS
  Filled 2015-11-02 (×2): qty 5

## 2015-11-02 NOTE — Progress Notes (Signed)
CSW informed that patient is medically stable to discharge today. Contacated Lupita Leashonna- [name] RN. Left voicemail. Awaiting phone call back.  Woodroe Modehristina Sheriece Jefcoat, MSW, LCSW, LCAS-A Clinical Social Worker [phone]

## 2015-11-02 NOTE — Progress Notes (Signed)
[facility] - [place] at [facility]   PATIENT NAME: Martin Randolph    MR#:  [id number]  DATE OF BIRTH:  1965/07/13  SUBJECTIVE:  Sitter in the room. Per RN no seizures. Pt noncommuniccative  REVIEW OF SYSTEMS:   Review of Systems  Unable to perform ROS: Mental acuity   Tolerating Diet:yes Tolerating PT: no  DRUG ALLERGIES:  No Known Allergies  VITALS:  Blood pressure (!) 107/55, pulse (!) 52, temperature 97.6 F (36.4 C), temperature source Oral, resp. rate 20, height 5\' 3"  (1.6 m), weight 55.8 kg (123 lb 1.6 oz), SpO2 100 %.  PHYSICAL EXAMINATION:   Physical Exam  GENERAL:  50 y.o.-year-old patient lying in the bed with no acute distress. Down syndrome features EYES: Pupils equal, round, reactive to light and accommodation. No scleral icterus. Extraocular muscles intact.  HEENT: Head atraumatic, normocephalic. Oropharynx and nasopharynx clear.  NECK:  Supple, no jugular venous distention. No thyroid enlargement, no tenderness.  LUNGS: Normal breath sounds bilaterally, no wheezing, rales, rhonchi. No use of accessory muscles of respiration.  CARDIOVASCULAR: S1, S2 normal. No murmurs, rubs, or gallops.  ABDOMEN: Soft, nontender, nondistended. Bowel sounds present. No organomegaly or mass.  EXTREMITIES: No cyanosis, clubbing or edema b/l.    NEUROLOGIC: unable to assess due to MS PSYCHIATRIC:  patient is alert , does not communicate SKIN: No obvious rash, lesion, or ulcer.   LABORATORY PANEL:  CBC  Recent Labs Lab 10/30/15 0528  WBC 6.4  HGB 14.3  HCT 41.9  PLT 290    Chemistries   Recent Labs Lab 10/29/15 1949  11/02/15 0448  NA 141  < > 134*  K 4.5  < > 4.4  CL 101  < > 98*  CO2 31  < > 29  GLUCOSE 32*  < > 347*  BUN 21*  < > 18  CREATININE 0.88  < > 0.89  CALCIUM 9.3  < > 8.6*  AST 31  --   --   ALT 27  --   --   ALKPHOS 68  --   --   BILITOT 0.4  --   --   < > = values in this interval not displayed. Cardiac  Enzymes No results for input(s): TROPONINI in the last 168 hours. RADIOLOGY:  Ct Head Wo Contrast  Result Date: 10/31/2015 CLINICAL DATA:  Tonic clonic seizure this morning. Pt was scanned in a left lateral position. Pt was moving around on the table between the topogram and th scan. EXAM: CT HEAD WITHOUT CONTRAST TECHNIQUE: Contiguous axial images were obtained from the base of the skull through the vertex without intravenous contrast. COMPARISON:  None. FINDINGS: Brain: The ventricles are enlarged, along with the sulci, consistent with advanced atrophy. No convincing hydrocephalus. There is white matter hypoattenuation with relative cortical thinning along the anterior frontal lobes without a convincing infarct. This could be related to old trauma. There are no parenchymal masses or mass effect. There is no evidence of a recent cortical infarct. There is a sliver of hypo attenuating fluid along the right cerebrum, higher attenuation than CSF. This is consistent with a small chronic subdural hemorrhage, measuring a maximum of approximately 5 mm in thickness. There is no significant mass effect or midline shift. There is no acute intracranial hemorrhage. Vascular: No hyperdense vessel or unexpected calcification. Skull: Normal. Negative for fracture or focal lesion. Sinuses/Orbits: No acute finding. Other: None IMPRESSION: 1. 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 2.  No acute intracranial abnormalities. No evidence of a recent infarct or of intracranial hemorrhage. 3. Advanced atrophy for age. Electronically Signed   By: David  Ormond M.D.   On: 10/31/2015 14:43   ASSESSMENT AND PLAN:    50 year old who Martin Randolph cognitive impairment   1. Acute on chronic Seizure Apparently patient had 3 seizures at the facility Patient normally on Depakote however Depakote has caused him to have hypoglycemia in the past  - will stop Depakote continue Keppra -no seizures  reported D/c sitter  2.  DMII with  hypoglycemia  -sugars on the higher end now. Increased lantus and added novolog tid to cover meals as per DM coordinator  3.  Hypotension now resolved  4. Down syndrome - resume Randolph medication  Will d/c later today if ok with Martin Randolph  Case discussed with Care Management/Social Worker. CODE STATUS: full  DVT Prophylaxis: lovnox TOTAL TIME TAKING CARE OF THIS PATIENT: 30minutes.  >50% time spent on counselling and coordination of care  POSSIBLE D/C  Today DEPENDING ON CLINICAL CONDITION.  Note: This dictation was prepared with Dragon dictation along with smaller phrase technology. Any transcriptional errors that result from this process are unintentional.  Trev Boley M.D on 11/02/2015 at 11:08 AM  Between 7am to 6pm - Pager - [phone]  After 6pm go to [email or web address] - password [email or web address] [facility]  [facility]  Office  [phone]  CC: Primary care physician; Martin Spillersracy N McLean-Scocuzza, MD

## 2015-11-02 NOTE — Progress Notes (Signed)
Subjective: Patient awake.  No further seizures noted.    Objective: Current vital signs: BP (!) 107/55 (BP Location: Left Arm)   Pulse (!) 52   Temp 97.6 F (36.4 C) (Oral)   Resp 20   Ht 5\' 3"  (1.6 m)   Wt 55.8 kg (123 lb 1.6 oz)   SpO2 100%   BMI 21.81 kg/m  Vital signs in last 24 hours: Temp:  [97.2 F (36.2 C)-97.6 F (36.4 C)] 97.6 F (36.4 C) (09/29 0415) Pulse Rate:  [52-79] 52 (09/29 0415) Resp:  [18-20] 20 (09/29 0415) BP: (97-107)/(55-61) 107/55 (09/29 0415) SpO2:  [98 %-100 %] 100 % (09/29 0415)  Intake/Output from previous day: 09/28 0701 - 09/29 0700 In: 720 [P.O.:720] Out: -  Intake/Output this shift: Total I/O In: 240 [P.O.:240] Out: -  Nutritional status: Diet heart healthy/carb modified Room service appropriate? Yes; Fluid consistency: Thin  Neurologic Exam: Alert. No speech. Does not follow commands.  Cranial Nerves: II: Discs flat bilaterally; Does not blink to bilateral confrontation. Pupils pinpoint and unreactive. III,IV, VI: Oculocephalic maneuvers intact V,VII: intact corneals VIII: unable to test IX,X: unable to test XI: unable to test XII: unable to test Motor: Moves all extremities spntaneously  Lab Results: Basic Metabolic Panel:  Recent Labs Lab 10/29/15 1949 10/30/15 0528 11/02/15 0448  NA 141 138 134*  K 4.5 4.2 4.4  CL 101 101 98*  CO2 31 31 29   GLUCOSE 32* 278* 347*  BUN 21* 17 18  CREATININE 0.88 0.69 0.89  CALCIUM 9.3 8.9 8.6*    Liver Function Tests:  Recent Labs Lab 10/29/15 1949  AST 31  ALT 27  ALKPHOS 68  BILITOT 0.4  PROT 6.9  ALBUMIN 3.8    Recent Labs Lab 10/29/15 1949  LIPASE 16   No results for input(s): AMMONIA in the last 168 hours.  CBC:  Recent Labs Lab 10/29/15 1949 10/30/15 0528  WBC 9.0 6.4  NEUTROABS 6.2  --   HGB 14.1 14.3  HCT 42.2 41.9  MCV 102.7* 103.0*  PLT 304 290    Cardiac Enzymes: No results for input(s): CKTOTAL, CKMB, CKMBINDEX, TROPONINI in the  last 168 hours.  Lipid Panel: No results for input(s): CHOL, TRIG, HDL, CHOLHDL, VLDL, LDLCALC in the last 168 hours.  CBG:  Recent Labs Lab 11/01/15 1948 11/01/15 2336 11/02/15 0350 11/02/15 0748 11/02/15 1133  GLUCAP 79 199* 339* 387* 469*    Microbiology: Results for orders placed or performed during the hospital encounter of 10/29/15  MRSA PCR Screening     Status: None   Collection Time: 10/30/15  4:59 AM  Result Value Ref Range Status   MRSA by PCR NEGATIVE NEGATIVE Final    Comment:        The GeneXpert MRSA Assay (FDA approved for NASAL specimens only), is one component of a comprehensive MRSA colonization surveillance program. It is not intended to diagnose MRSA infection nor to guide or monitor treatment for MRSA infections.     Coagulation Studies: No results for input(s): LABPROT, INR in the last 72 hours.  Imaging: Ct Head Wo Contrast  Result Date: 10/31/2015 CLINICAL DATA:  Tonic clonic seizure this morning. Pt was scanned in a left lateral position. Pt was moving around on the table between the topogram and th scan. EXAM: CT HEAD WITHOUT CONTRAST TECHNIQUE: Contiguous axial images were obtained from the base of the skull through the vertex without intravenous contrast. COMPARISON:  None. FINDINGS: Brain: The ventricles are enlarged, along with  the sulci, consistent with advanced atrophy. No convincing hydrocephalus. There is white matter hypoattenuation with relative cortical thinning along the anterior frontal lobes without a convincing infarct. This could be related to old trauma. There are no parenchymal masses or mass effect. There is no evidence of a recent cortical infarct. There is a sliver of hypo attenuating fluid along the right cerebrum, higher attenuation than CSF. This is consistent with a small chronic subdural hemorrhage, measuring a maximum of approximately 5 mm in thickness. There is no significant mass effect or midline shift. There is no  acute intracranial hemorrhage. Vascular: No hyperdense vessel or unexpected calcification. Skull: Normal. Negative for fracture or focal lesion. Sinuses/Orbits: No acute finding. Other: None IMPRESSION: 1. 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 2. No acute intracranial abnormalities. No evidence of a recent infarct or of intracranial hemorrhage. 3. Advanced atrophy for age. Electronically Signed   By: Amie Portland M.D.   On: 10/31/2015 14:43    Medications:  I have reviewed the patient's current medications. Scheduled: . carbamide peroxide  5-10 drop Both Ears BID  . donepezil  10 mg Oral QHS  . enoxaparin (LOVENOX) injection  40 mg Subcutaneous QHS  . famotidine  20 mg Oral Daily  . feeding supplement (GLUCERNA SHAKE)  237 mL Oral TID BM  . gabapentin  400 mg Oral QHS  . insulin aspart  0-9 Units Subcutaneous TID WC  . insulin aspart  3 Units Subcutaneous TID WC  . insulin glargine  15 Units Subcutaneous QHS  . levETIRAcetam  500 mg Oral BID  . Melatonin  5 mg Oral QHS  . metFORMIN  1,000 mg Oral BID WC  . polyethylene glycol  17 g Oral Q3 days  . QUEtiapine  25 mg Oral QHS  . QUEtiapine  50 mg Oral BID  . simvastatin  10 mg Oral QHS  . sodium chloride flush  3 mL Intravenous Q12H  . tamsulosin  0.4 mg Oral QHS    Assessment/Plan: No further seizures noted.  Patient on Neurontin and Keppra.  Recommendations: 1.  Continue anticonvulsant therapy at current doses.   2.  Follow up with neurology on an outpatient basis 3.  No further neurologic intervention is recommended at this time.  If further questions arise, please call or page at that time.  Thank you for allowing neurology to participate in the care of this patient.    LOS: 1 day   Thana Farr, MD Neurology [phone] 11/02/2015  12:50 PM

## 2015-11-02 NOTE — Care Management Important Message (Signed)
Important Message  Patient Details  Name: Martin Randolph MRN: [id number] Date of Birth: [birth year]   Medicare Important Message Given:  Yes    Gwenette GreetBrenda S Journey Ratterman, RN 11/02/2015, 8:18 AM

## 2015-11-02 NOTE — Progress Notes (Signed)
Inpatient Diabetes Program Recommendations  AACE/ADA: New Consensus Statement on Inpatient Glycemic Control (2015)  Target Ranges:  Prepandial:   less than 140 mg/dL      Peak postprandial:   less than 180 mg/dL (1-2 hours)      Critically ill patients:  140 - 180 mg/dL   Results for Martin Randolph, Martin Randolph (MRN [id number]) as of 11/02/2015 07:50  Ref. Range 11/01/2015 08:04 11/01/2015 08:08 11/01/2015 09:08 11/01/2015 12:11 11/01/2015 16:24 11/01/2015 19:48 11/01/2015 23:36 11/02/2015 03:50  Glucose-Capillary Latest Ref Range: 65 - 99 mg/dL 30 (LL) 35 (LL) 644143 (H) 410 (H) 302 (H) 79 199 (H) 339 (H)   Review of Glycemic Control  Outpatient Diabetes medications: Lantus 12 units QHS, Humalog 0-10 units TID with meals, Metformin 1000 mg BID Current orders for Inpatient glycemic control: Lantus 12 units QHS, Novolog 0-9 units TID with meals, Metformin 1000 mg BID  Inpatient Diabetes Program Recommendations:  Insulin - Basal: Please consider increasing Lantus to 14 units QHS. Insulin - Meal Coverage: Glucose up to 410 mg/dl on 0/34/749/28/17 after meal intake. Post prandial glucose consistently elevated. Patient would benefit from ordered Novolog meal coverage to prevent spikes in glucose after eating. Please consider ordering Novolog 3 units TID with meals for meal coverage if patient eats at least 50% of meals.  Outpatient Referral: Recommend patient be advised to follow up as an outpatient with an Endocrinologist for assistance with improving glycemic control.  Thanks, Orlando PennerMarie Azarya Oconnell, RN, MSN, CDE Diabetes Coordinator Inpatient Diabetes Program [phone] (Team Pager from 8am to 5pm) [phone] ([facility] office) [phone] [facility] office) [phone] [facility] office)

## 2015-11-02 NOTE — Plan of Care (Signed)
Problem: Safety: Goal: Ability to remain free from injury will improve Outcome: Progressing Sitter 1:1 removed. Safety rounder initiated.

## 2015-11-03 LAB — GLUCOSE, CAPILLARY
GLUCOSE-CAPILLARY: 108 mg/dL — AB (ref 65–99)
GLUCOSE-CAPILLARY: 200 mg/dL — AB (ref 65–99)
GLUCOSE-CAPILLARY: 330 mg/dL — AB (ref 65–99)
GLUCOSE-CAPILLARY: 345 mg/dL — AB (ref 65–99)
GLUCOSE-CAPILLARY: 79 mg/dL (ref 65–99)
Glucose-Capillary: 79 mg/dL (ref 65–99)

## 2015-11-03 MED ORDER — GLUCOSE 40 % PO GEL: 1.0000 | Freq: Once | ORAL | 2 refills | Status: AC | PRN

## 2015-11-03 MED ORDER — GLUCOSE 40 % PO GEL: 1.0000 | Freq: Once | ORAL | Status: DC | PRN

## 2015-11-03 MED ORDER — INSULIN GLARGINE 100 UNIT/ML ~~LOC~~ SOLN: 14.0000 [IU] | Freq: Every day | SUBCUTANEOUS | 11 refills | Status: DC

## 2015-11-03 MED ORDER — LEVETIRACETAM 500 MG PO TABS: 500.0000 mg | ORAL_TABLET | Freq: Two times a day (BID) | ORAL | 3 refills | Status: AC

## 2015-11-03 MED ORDER — INSULIN ASPART 100 UNIT/ML ~~LOC~~ SOLN: 5.0000 [IU] | Freq: Three times a day (TID) | SUBCUTANEOUS | 11 refills | Status: DC

## 2015-11-03 NOTE — NC FL2 (Addendum)
[place] MEDICAID FL2 LEVEL OF CARE SCREENING TOOL     IDENTIFICATION  Patient Name: Martin Randolph Birthdate: [birth year] Sex: male Admission Date (Current Location): 10/29/2015  [place]   Facility and Address:  [facility], [address], [place], [place] [address]      Provider Number: [id number]  Attending Physician Name and Address:  Enedina FinnerSona Patel, MD  Relative Name and Phone Number:       Current Level of Care: Hospital [facility] Prior Approval Number:    Date Approved/Denied: 08/14/15 PASRR Number: [id number]  Discharge Plan: Other (Comment) (Family Care Home/Group Home)    Current Diagnoses: Patient Active Problem List   Diagnosis Date Noted  . Seizure (HCC)   . Hypoglycemia 10/29/2015  . Diabetes (HCC) 10/29/2015  . Down syndrome 10/29/2015    Orientation RESPIRATION BLADDER Height & Weight     Self, Time  Normal Continent Weight: 123 lb 1.6 oz (55.8 kg) Height:  5\' 3"  (160 cm)  BEHAVIORAL SYMPTOMS/MOOD NEUROLOGICAL BOWEL NUTRITION STATUS      Continent    AMBULATORY STATUS COMMUNICATION OF NEEDS Skin     Verbally Normal                       Personal Care Assistance Level of Assistance  Bathing, Dressing Bathing Assistance: Limited assistance/extensive Assitance   Dressing Assistance: Limited assistance/extensive Assitance     Functional Limitations Info             SPECIAL CARE FACTORS FREQUENCY   Blood Glucose     check QID/2 hours post midday and evening meal               Contractures Contractures Info: Present    Additional Factors Info                  DISCHARGE MEDICATIONS:       Current Discharge Medication List        START taking these medications   Details  dextrose (GLUTOSE) 40 % GEL Take 37.5 g by mouth once as needed for low blood sugar. Qty: 1 Tube, Refills: 2    insulin aspart (NOVOLOG) 100  UNIT/ML injection Inject 5 Units into the skin 3 (three) times daily with meals. Qty: 10 mL, Refills: 11    levETIRAcetam (KEPPRA) 500 MG tablet Take 1 tablet (500 mg total) by mouth 2 (two) times daily. Qty: 60 tablet, Refills: 3          CONTINUE these medications which have CHANGED   Details  insulin glargine (LANTUS) 100 UNIT/ML injection Inject 0.14 mLs (14 Units total) into the skin at bedtime. Qty: 10 mL, Refills: 11          CONTINUE these medications which have NOT CHANGED   Details  acetaminophen (TYLENOL) 325 MG tablet Take 650 mg by mouth every 6 (six) hours as needed for mild pain.    carbamide peroxide (DEBROX) 6.5 % otic solution Place 5-10 drops into both ears 2 (two) times daily.    cetaphil (CETAPHIL) lotion Apply 1 application topically 2 (two) times daily.    donepezil (ARICEPT) 10 MG tablet Take 10 mg by mouth at bedtime.    famotidine (PEPCID) 20 MG tablet Take 20 mg by mouth daily.    feeding supplement, GLUCERNA SHAKE, (GLUCERNA SHAKE) LIQD Take 237 mLs by mouth 3 (three) times daily between meals.    gabapentin (  NEURONTIN) 400 MG capsule Take 400 mg by mouth at bedtime.    Melatonin 3 MG TABS Take 3 mg by mouth at bedtime.    metFORMIN (GLUCOPHAGE) 1000 MG tablet Take 1,000 mg by mouth 2 (two) times daily with a meal.    polyethylene glycol (MIRALAX / GLYCOLAX) packet Take 17 g by mouth every 3 (three) days.    !! QUEtiapine (SEROQUEL) 25 MG tablet Take 25 mg by mouth at bedtime.    !! QUEtiapine (SEROQUEL) 25 MG tablet Take 25 mg by mouth 2 (two) times daily as needed (agitation).    !! QUEtiapine (SEROQUEL) 50 MG tablet Take 50 mg by mouth 2 (two) times daily.    simvastatin (ZOCOR) 10 MG tablet Take 10 mg by mouth at bedtime.    tamsulosin (FLOMAX) 0.4 MG CAPS capsule Take 0.4 mg by mouth at bedtime.     !! - Potential duplicate medications found. Please discuss with provider.       STOP taking these medications      divalproex (DEPAKOTE SPRINKLE) 125 MG capsule      insulin lispro (HUMALOG) 100 UNIT/ML injection        Continue PRN medications. Relevant Imaging Results:  Relevant Lab Results:   Additional Information  SS [id number]  Judi Cong, LCSW

## 2015-11-03 NOTE — Clinical Social Work Note (Signed)
Patient to dc back to [facility] via facility transport. His RNCM [name] is aware. FL 2 transmitted to [facility]. CSW will con't to follow pending any additional dc needs.  Argentina PonderKaren Martha Jamell Laymon, MSW, LCSW-A [phone]

## 2015-11-03 NOTE — Discharge Summary (Addendum)
[facility] - [place] at [facility]   PATIENT NAME: Martin Randolph    MR#:  [id number]  DATE OF BIRTH:  Nov 01, 1965  DATE OF ADMISSION:  10/29/2015 ADMITTING PHYSICIAN: Oralia Manis, MD  DATE OF DISCHARGE: 11/03/15  PRIMARY CARE PHYSICIAN: Pasty Spillers McLean-Scocuzza, MD    ADMISSION DIAGNOSIS:  Down syndrome [Q90.9] Hypoglycemia [E16.2] Chronic dementia, without behavioral disturbance [F03.90]  DISCHARGE DIAGNOSIS:  Hypoglycemia-resolved Labile Dm-2 Chronic seizures Down's syndrome  SECONDARY DIAGNOSIS:   Past Medical History:  Diagnosis Date  . Alzheimer's dementia   . Diabetes (HCC)   . Down syndrome     HOSPITAL COURSE:   50 year old who H has cognitive impairment   1. Acute on chronic Seizure Apparently patient had 3 seizures at the facility Patient normally on Depakote however Depakote has caused him to have hypoglycemia in the past  - will stop Depakote continue Keppra -no seizures reported  2. DMII with hypoglycemia  -sugars on the higher end now. Increased lantus and added novolog tid to cover meals as per DM coordinator -bedtime snack  -sugars improved  3. Hypotension now resolved  4. Down syndrome - resume home medication  Overall at baseline Spoke with mom in the room D/c to group home spoke with Lupita Leash RN at the group home about his insulin regimen CONSULTS OBTAINED:  Treatment Team:  Thana Farr, MD Enedina Finner, MD  DRUG ALLERGIES:  No Known Allergies  DISCHARGE MEDICATIONS:   Current Discharge Medication List    START taking these medications   Details  dextrose (GLUTOSE) 40 % GEL Take 37.5 g by mouth once as needed for low blood sugar. Qty: 1 Tube, Refills: 2    insulin aspart (NOVOLOG) 100 UNIT/ML injection Inject 5 Units into the skin 3 (three) times daily with meals. Qty: 10 mL, Refills: 11    levETIRAcetam (KEPPRA) 500 MG tablet Take 1 tablet (500 mg total) by mouth 2 (two) times daily. Qty:  60 tablet, Refills: 3      CONTINUE these medications which have CHANGED   Details  insulin glargine (LANTUS) 100 UNIT/ML injection Inject 0.14 mLs (14 Units total) into the skin at bedtime. Qty: 10 mL, Refills: 11      CONTINUE these medications which have NOT CHANGED   Details  acetaminophen (TYLENOL) 325 MG tablet Take 650 mg by mouth every 6 (six) hours as needed for mild pain.    carbamide peroxide (DEBROX) 6.5 % otic solution Place 5-10 drops into both ears 2 (two) times daily.    cetaphil (CETAPHIL) lotion Apply 1 application topically 2 (two) times daily.    donepezil (ARICEPT) 10 MG tablet Take 10 mg by mouth at bedtime.    famotidine (PEPCID) 20 MG tablet Take 20 mg by mouth daily.    feeding supplement, GLUCERNA SHAKE, (GLUCERNA SHAKE) LIQD Take 237 mLs by mouth 3 (three) times daily between meals.    gabapentin (NEURONTIN) 400 MG capsule Take 400 mg by mouth at bedtime.    Melatonin 3 MG TABS Take 3 mg by mouth at bedtime.    metFORMIN (GLUCOPHAGE) 1000 MG tablet Take 1,000 mg by mouth 2 (two) times daily with a meal.    polyethylene glycol (MIRALAX / GLYCOLAX) packet Take 17 g by mouth every 3 (three) days.    !! QUEtiapine (SEROQUEL) 25 MG tablet Take 25 mg by mouth at bedtime.    !! QUEtiapine (SEROQUEL) 25 MG tablet Take 25 mg by mouth 2 (two) times daily as needed (agitation).    !!  QUEtiapine (SEROQUEL) 50 MG tablet Take 50 mg by mouth 2 (two) times daily.    simvastatin (ZOCOR) 10 MG tablet Take 10 mg by mouth at bedtime.    tamsulosin (FLOMAX) 0.4 MG CAPS capsule Take 0.4 mg by mouth at bedtime.     !! - Potential duplicate medications found. Please discuss with provider.    STOP taking these medications     divalproex (DEPAKOTE SPRINKLE) 125 MG capsule      insulin lispro (HUMALOG) 100 UNIT/ML injection         If you experience worsening of your admission symptoms, develop shortness of breath, life threatening emergency, suicidal or homicidal  thoughts you must seek medical attention immediately by calling 911 or calling your MD immediately  if symptoms less severe.  You Must read complete instructions/literature along with all the possible adverse reactions/side effects for all the Medicines you take and that have been prescribed to you. Take any new Medicines after you have completely understood and accept all the possible adverse reactions/side effects.   Please note  You were cared for by a hospitalist during your hospital stay. If you have any questions about your discharge medications or the care you received while you were in the hospital after you are discharged, you can call the unit and asked to speak with the hospitalist on call if the hospitalist that took care of you is not available. Once you are discharged, your primary care physician will handle any further medical issues. Please note that NO REFILLS for any discharge medications will be authorized once you are discharged, as it is imperative that you return to your primary care physician (or establish a relationship with a primary care physician if you do not have one) for your aftercare needs so that they can reassess your need for medications and monitor your lab values. Today   SUBJECTIVE  Eating bagel. Mom in the room No seizures reported   VITAL SIGNS:  Blood pressure (!) 106/53, pulse 69, temperature 97.9 F (36.6 C), temperature source Oral, resp. rate 18, height 5\' 3"  (1.6 m), weight 55.8 kg (123 lb 1.6 oz), SpO2 100 %.  I/O:    Intake/Output Summary (Last 24 hours) at 11/03/15 1047 Last data filed at 11/03/15 0900  Gross per 24 hour  Intake              720 ml  Output                0 ml  Net              720 ml    PHYSICAL EXAMINATION:  GENERAL:  50 y.o.-year-old patient lying in the bed with no acute distress.  EYES: Pupils equal, round, reactive to light and accommodation. No scleral icterus. Extraocular muscles intact. Downs syndrome  features HEENT: Head atraumatic, normocephalic. Oropharynx and nasopharynx clear.  NECK:  Supple, no jugular venous distention. No thyroid enlargement, no tenderness.  LUNGS: Normal breath sounds bilaterally, no wheezing, rales,rhonchi or crepitation. No use of accessory muscles of respiration.  CARDIOVASCULAR: S1, S2 normal. No murmurs, rubs, or gallops.  ABDOMEN: Soft, non-tender, non-distended. Bowel sounds present. No organomegaly or mass.  EXTREMITIES: No pedal edema, cyanosis, or clubbing.  NEUROLOGIC:moves all extremites well PSYCHIATRIC: The patient is alert  SKIN: No obvious rash, lesion, or ulcer.   DATA REVIEW:   CBC   Recent Labs Lab 10/30/15 0528  WBC 6.4  HGB 14.3  HCT 41.9  PLT 290  Chemistries   Recent Labs Lab 10/29/15 1949  11/02/15 0448  NA 141  < > 134*  K 4.5  < > 4.4  CL 101  < > 98*  CO2 31  < > 29  GLUCOSE 32*  < > 347*  BUN 21*  < > 18  CREATININE 0.88  < > 0.89  CALCIUM 9.3  < > 8.6*  AST 31  --   --   ALT 27  --   --   ALKPHOS 68  --   --   BILITOT 0.4  --   --   < > = values in this interval not displayed.  Microbiology Results   Recent Results (from the past 240 hour(s))  MRSA PCR Screening     Status: None   Collection Time: 10/30/15  4:59 AM  Result Value Ref Range Status   MRSA by PCR NEGATIVE NEGATIVE Final    Comment:        The GeneXpert MRSA Assay (FDA approved for NASAL specimens only), is one component of a comprehensive MRSA colonization surveillance program. It is not intended to diagnose MRSA infection nor to guide or monitor treatment for MRSA infections.     RADIOLOGY:  No results found.   Management plans discussed with the patient, family and they are in agreement.  CODE STATUS:     Code Status Orders        Start     Ordered   10/29/15 2308  Full code  Continuous     10/29/15 2307    Code Status History    Date Active Date Inactive Code Status Order ID Comments User Context   This  patient has a current code status but no historical code status.      TOTAL TIME TAKING CARE OF THIS PATIENT:40 minutes.    Sharlyn Odonnel M.D on 11/03/2015 at 10:47 AM  Between 7am to 6pm - Pager - [phone] After 6pm go to [email or web address] - password [email or web address] [facility]  [place]  [facility]  Office  [phone]  CC: Primary care physician; Pasty Spillers McLean-Scocuzza, MD

## 2015-11-03 NOTE — Discharge Instructions (Signed)
CHECK SUGARS AC and HS -PLEASE give Bedtime snack to pt. If he is sleeping wake him up and give the snack

## 2015-11-03 NOTE — Progress Notes (Signed)
[facility] Spoke with mother regarding discharge. Report called to [name] at [facility]. No unanswered questions. [name] on Unit for transport. Discharged paperwork provided to [facility]. No unanswered questions. Discharged via wheelchair by nursing staff. Belongings sent with mother.

## 2015-11-15 ENCOUNTER — Inpatient Hospital Stay: Payer: Medicare (Managed Care)

## 2015-11-15 ENCOUNTER — Encounter: Payer: Self-pay | Admitting: Emergency Medicine

## 2015-11-15 ENCOUNTER — Inpatient Hospital Stay
Admission: EM | Admit: 2015-11-15 | Discharge: 2015-11-20 | DRG: 637 | Disposition: A | Discharge status: Skilled Nursing Facility | Payer: Medicare (Managed Care) | Attending: Internal Medicine | Admitting: Internal Medicine

## 2015-11-15 DIAGNOSIS — Q909 Down syndrome, unspecified: Secondary | ICD-10-CM | POA: Diagnosis not present

## 2015-11-15 DIAGNOSIS — Z794 Long term (current) use of insulin: Secondary | ICD-10-CM

## 2015-11-15 DIAGNOSIS — Z6820 Body mass index (BMI) 20.0-20.9, adult: Secondary | ICD-10-CM | POA: Diagnosis not present

## 2015-11-15 DIAGNOSIS — E039 Hypothyroidism, unspecified: Secondary | ICD-10-CM | POA: Diagnosis present

## 2015-11-15 DIAGNOSIS — R739 Hyperglycemia, unspecified: Secondary | ICD-10-CM | POA: Diagnosis not present

## 2015-11-15 DIAGNOSIS — R6510 Systemic inflammatory response syndrome (SIRS) of non-infectious origin without acute organ dysfunction: Secondary | ICD-10-CM | POA: Diagnosis present

## 2015-11-15 DIAGNOSIS — Z87820 Personal history of traumatic brain injury: Secondary | ICD-10-CM | POA: Diagnosis not present

## 2015-11-15 DIAGNOSIS — L8990 Pressure ulcer of unspecified site, unspecified stage: Secondary | ICD-10-CM | POA: Insufficient documentation

## 2015-11-15 DIAGNOSIS — Z66 Do not resuscitate: Secondary | ICD-10-CM | POA: Diagnosis present

## 2015-11-15 DIAGNOSIS — R451 Restlessness and agitation: Secondary | ICD-10-CM | POA: Diagnosis present

## 2015-11-15 DIAGNOSIS — G9341 Metabolic encephalopathy: Secondary | ICD-10-CM | POA: Diagnosis present

## 2015-11-15 DIAGNOSIS — R001 Bradycardia, unspecified: Secondary | ICD-10-CM | POA: Diagnosis not present

## 2015-11-15 DIAGNOSIS — N179 Acute kidney failure, unspecified: Secondary | ICD-10-CM | POA: Diagnosis present

## 2015-11-15 DIAGNOSIS — I959 Hypotension, unspecified: Secondary | ICD-10-CM | POA: Diagnosis present

## 2015-11-15 DIAGNOSIS — I4510 Unspecified right bundle-branch block: Secondary | ICD-10-CM | POA: Diagnosis present

## 2015-11-15 DIAGNOSIS — R569 Unspecified convulsions: Secondary | ICD-10-CM | POA: Diagnosis not present

## 2015-11-15 DIAGNOSIS — F79 Unspecified intellectual disabilities: Secondary | ICD-10-CM | POA: Diagnosis present

## 2015-11-15 DIAGNOSIS — G309 Alzheimer's disease, unspecified: Secondary | ICD-10-CM | POA: Diagnosis present

## 2015-11-15 DIAGNOSIS — R05 Cough: Secondary | ICD-10-CM | POA: Diagnosis present

## 2015-11-15 DIAGNOSIS — L89301 Pressure ulcer of unspecified buttock, stage 1: Secondary | ICD-10-CM | POA: Diagnosis present

## 2015-11-15 DIAGNOSIS — E10649 Type 1 diabetes mellitus with hypoglycemia without coma: Secondary | ICD-10-CM | POA: Diagnosis not present

## 2015-11-15 DIAGNOSIS — R2689 Other abnormalities of gait and mobility: Secondary | ICD-10-CM

## 2015-11-15 DIAGNOSIS — R112 Nausea with vomiting, unspecified: Secondary | ICD-10-CM

## 2015-11-15 DIAGNOSIS — R4789 Other speech disturbances: Secondary | ICD-10-CM | POA: Diagnosis present

## 2015-11-15 DIAGNOSIS — E43 Unspecified severe protein-calorie malnutrition: Secondary | ICD-10-CM | POA: Diagnosis present

## 2015-11-15 DIAGNOSIS — E872 Acidosis, unspecified: Secondary | ICD-10-CM

## 2015-11-15 DIAGNOSIS — E860 Dehydration: Secondary | ICD-10-CM | POA: Diagnosis present

## 2015-11-15 DIAGNOSIS — E876 Hypokalemia: Secondary | ICD-10-CM | POA: Diagnosis present

## 2015-11-15 DIAGNOSIS — E785 Hyperlipidemia, unspecified: Secondary | ICD-10-CM | POA: Diagnosis present

## 2015-11-15 DIAGNOSIS — F0280 Dementia in other diseases classified elsewhere without behavioral disturbance: Secondary | ICD-10-CM | POA: Diagnosis present

## 2015-11-15 DIAGNOSIS — R059 Cough, unspecified: Secondary | ICD-10-CM

## 2015-11-15 DIAGNOSIS — E1010 Type 1 diabetes mellitus with ketoacidosis without coma: Secondary | ICD-10-CM | POA: Diagnosis present

## 2015-11-15 DIAGNOSIS — Z95828 Presence of other vascular implants and grafts: Secondary | ICD-10-CM

## 2015-11-15 HISTORY — DX: Hypothyroidism, unspecified: E03.9

## 2015-11-15 HISTORY — DX: Hyperlipidemia, unspecified: E78.5

## 2015-11-15 HISTORY — DX: Aphasia: R47.01

## 2015-11-15 HISTORY — DX: Unspecified convulsions: R56.9

## 2015-11-15 LAB — TROPONIN I

## 2015-11-15 LAB — GLUCOSE, CAPILLARY
GLUCOSE-CAPILLARY: 425 mg/dL — AB (ref 65–99)
GLUCOSE-CAPILLARY: 417 mg/dL — AB (ref 65–99)
GLUCOSE-CAPILLARY: 108 mg/dL — AB (ref 65–99)
Glucose-Capillary: 509 mg/dL (ref 65–99)
Glucose-Capillary: 369 mg/dL — ABNORMAL HIGH (ref 65–99)
Glucose-Capillary: 230 mg/dL — ABNORMAL HIGH (ref 65–99)
Glucose-Capillary: 158 mg/dL — ABNORMAL HIGH (ref 65–99)
Glucose-Capillary: 158 mg/dL — ABNORMAL HIGH (ref 65–99)
Glucose-Capillary: 83 mg/dL (ref 65–99)
Glucose-Capillary: 58 mg/dL — ABNORMAL LOW (ref 65–99)
Glucose-Capillary: 121 mg/dL — ABNORMAL HIGH (ref 65–99)
Glucose-Capillary: 126 mg/dL — ABNORMAL HIGH (ref 65–99)
Glucose-Capillary: 191 mg/dL — ABNORMAL HIGH (ref 65–99)

## 2015-11-15 LAB — COMPREHENSIVE METABOLIC PANEL
ALK PHOS: 116 U/L (ref 38–126)
ALT: 34 U/L (ref 17–63)
AST: 28 U/L (ref 15–41)
Albumin: 3.6 g/dL (ref 3.5–5.0)
Anion gap: 22 — ABNORMAL HIGH (ref 5–15)
BILIRUBIN TOTAL: 1.4 mg/dL — AB (ref 0.3–1.2)
BUN: 30 mg/dL — AB (ref 6–20)
CALCIUM: 9.4 mg/dL (ref 8.9–10.3)
CHLORIDE: 96 mmol/L — AB (ref 101–111)
CO2: 15 mmol/L — ABNORMAL LOW (ref 22–32)
CREATININE: 1.41 mg/dL — AB (ref 0.61–1.24)
GFR, EST NON AFRICAN AMERICAN: 57 mL/min — AB (ref >60)
Glucose, Bld: 513 mg/dL (ref 65–99)
Potassium: 5.2 mmol/L — ABNORMAL HIGH (ref 3.5–5.1)
Sodium: 133 mmol/L — ABNORMAL LOW (ref 135–145)
Total Protein: 6.7 g/dL (ref 6.5–8.1)

## 2015-11-15 LAB — BASIC METABOLIC PANEL
Anion gap: 7 (ref 5–15)
BUN: 17 mg/dL (ref 6–20)
CALCIUM: 8.4 mg/dL — AB (ref 8.9–10.3)
CHLORIDE: 109 mmol/L (ref 101–111)
CO2: 23 mmol/L (ref 22–32)
CREATININE: 0.82 mg/dL (ref 0.61–1.24)
GFR calc non Af Amer: >60 mL/min (ref >60)
GLUCOSE: 60 mg/dL — AB (ref 65–99)
Potassium: 3.8 mmol/L (ref 3.5–5.1)
Sodium: 139 mmol/L (ref 135–145)

## 2015-11-15 LAB — CBC WITH DIFFERENTIAL/PLATELET
BASOS ABS: 0.1 10*3/uL (ref 0–0.1)
Basophils Relative: 1 %
Eosinophils Absolute: 0 10*3/uL (ref 0–0.7)
Eosinophils Relative: 0 %
HEMATOCRIT: 40 % (ref 40.0–52.0)
HEMOGLOBIN: 13.4 g/dL (ref 13.0–18.0)
LYMPHS ABS: 0.9 10*3/uL — AB (ref 1.0–3.6)
LYMPHS PCT: 14 %
MCH: 35.2 pg — AB (ref 26.0–34.0)
MCHC: 33.6 g/dL (ref 32.0–36.0)
MCV: 104.9 fL — AB (ref 80.0–100.0)
Monocytes Absolute: 0.3 10*3/uL (ref 0.2–1.0)
Monocytes Relative: 4 %
NEUTROS ABS: 5.4 10*3/uL (ref 1.4–6.5)
NEUTROS PCT: 81 %
PLATELETS: 262 10*3/uL (ref 150–440)
RBC: 3.81 MIL/uL — AB (ref 4.40–5.90)
RDW: 14.7 % — ABNORMAL HIGH (ref 11.5–14.5)
WBC: 6.7 10*3/uL (ref 3.8–10.6)

## 2015-11-15 LAB — LACTIC ACID, PLASMA
LACTIC ACID, VENOUS: 4.4 mmol/L — AB (ref 0.5–1.9)
LACTIC ACID, VENOUS: 3.3 mmol/L — AB (ref 0.5–1.9)

## 2015-11-15 LAB — BLOOD GAS, VENOUS
PCO2 VEN: 40 mmHg — AB (ref 44.0–60.0)
Patient temperature: 37
pH, Ven: 7.33 (ref 7.250–7.430)

## 2015-11-15 LAB — URINALYSIS COMPLETE WITH MICROSCOPIC (ARMC ONLY)
Bacteria, UA: NONE SEEN
Bilirubin Urine: NEGATIVE
HGB URINE DIPSTICK: NEGATIVE
Leukocytes, UA: NEGATIVE
Nitrite: NEGATIVE
PH: 5 (ref 5.0–8.0)
Protein, ur: NEGATIVE mg/dL
RBC / HPF: NONE SEEN RBC/hpf (ref 0–5)
Specific Gravity, Urine: 1.014 (ref 1.005–1.030)
WBC, UA: NONE SEEN WBC/hpf (ref 0–5)

## 2015-11-15 LAB — BETA-HYDROXYBUTYRIC ACID: Beta-Hydroxybutyric Acid: 6.41 mmol/L — ABNORMAL HIGH (ref 0.05–0.27)

## 2015-11-15 LAB — MRSA PCR SCREENING: MRSA by PCR: NEGATIVE

## 2015-11-15 LAB — TSH: TSH: 14.563 u[IU]/mL — ABNORMAL HIGH (ref 0.350–4.500)

## 2015-11-15 LAB — PROCALCITONIN

## 2015-11-15 MED ORDER — SODIUM CHLORIDE 0.9 % IV SOLN
INTRAVENOUS | Status: DC
  Administered 2015-11-15: 3.6 [IU]/h via INTRAVENOUS
  Filled 2015-11-15: qty 2.5

## 2015-11-15 MED ORDER — VANCOMYCIN HCL IN DEXTROSE 1-5 GM/200ML-% IV SOLN
1000.0000 mg | Freq: Once | INTRAVENOUS | Status: AC
  Administered 2015-11-15: 1000 mg via INTRAVENOUS
  Filled 2015-11-15: qty 200

## 2015-11-15 MED ORDER — POTASSIUM CHLORIDE 10 MEQ/100ML IV SOLN
10.0000 meq | INTRAVENOUS | Status: DC
  Filled 2015-11-15 (×2): qty 100

## 2015-11-15 MED ORDER — LEVETIRACETAM 500 MG PO TABS
500.0000 mg | ORAL_TABLET | Freq: Two times a day (BID) | ORAL | Status: DC
  Administered 2015-11-16 – 2015-11-20 (×9): 500 mg via ORAL
  Filled 2015-11-15 (×9): qty 1

## 2015-11-15 MED ORDER — SODIUM CHLORIDE 0.9% FLUSH
3.0000 mL | Freq: Two times a day (BID) | INTRAVENOUS | Status: DC
  Administered 2015-11-16 – 2015-11-20 (×9): 3 mL via INTRAVENOUS

## 2015-11-15 MED ORDER — GABAPENTIN 400 MG PO CAPS
400.0000 mg | ORAL_CAPSULE | Freq: Every day | ORAL | Status: DC
  Administered 2015-11-16 – 2015-11-19 (×4): 400 mg via ORAL
  Filled 2015-11-15 (×4): qty 1

## 2015-11-15 MED ORDER — SODIUM CHLORIDE 0.9 % IV SOLN
1000.0000 mL | Freq: Once | INTRAVENOUS | Status: AC
  Administered 2015-11-15: 1000 mL via INTRAVENOUS

## 2015-11-15 MED ORDER — ONDANSETRON HCL 4 MG PO TABS: 4.0000 mg | ORAL_TABLET | Freq: Four times a day (QID) | ORAL | Status: DC | PRN

## 2015-11-15 MED ORDER — LORAZEPAM 2 MG/ML IJ SOLN
1.0000 mg | INTRAMUSCULAR | Status: DC | PRN
  Administered 2015-11-15 – 2015-11-17 (×4): 1 mg via INTRAVENOUS
  Filled 2015-11-15 (×4): qty 1

## 2015-11-15 MED ORDER — SODIUM CHLORIDE 0.9 % IV SOLN
INTRAVENOUS | Status: AC
  Administered 2015-11-15: 15:00:00 via INTRAVENOUS

## 2015-11-15 MED ORDER — ACETAMINOPHEN 650 MG RE SUPP: 650.0000 mg | Freq: Four times a day (QID) | RECTAL | Status: DC | PRN

## 2015-11-15 MED ORDER — ACETAMINOPHEN 325 MG PO TABS
650.0000 mg | ORAL_TABLET | Freq: Four times a day (QID) | ORAL | Status: DC | PRN
Start: 2015-11-15 — End: 2015-11-20
  Administered 2015-11-16 – 2015-11-18 (×2): 650 mg via ORAL
  Filled 2015-11-15 (×2): qty 2

## 2015-11-15 MED ORDER — TAMSULOSIN HCL 0.4 MG PO CAPS
0.4000 mg | ORAL_CAPSULE | Freq: Every day | ORAL | Status: DC
  Administered 2015-11-16 – 2015-11-19 (×4): 0.4 mg via ORAL
  Filled 2015-11-15 (×4): qty 1

## 2015-11-15 MED ORDER — INSULIN ASPART 100 UNIT/ML ~~LOC~~ SOLN
5.0000 [IU] | Freq: Three times a day (TID) | SUBCUTANEOUS | Status: DC
  Administered 2015-11-16: 5 [IU] via SUBCUTANEOUS
  Filled 2015-11-15: qty 5

## 2015-11-15 MED ORDER — POLYETHYLENE GLYCOL 3350 17 G PO PACK: 17.0000 g | PACK | Freq: Every day | ORAL | Status: DC | PRN

## 2015-11-15 MED ORDER — IBUPROFEN 400 MG PO TABS: 400.0000 mg | ORAL_TABLET | Freq: Four times a day (QID) | ORAL | Status: DC | PRN

## 2015-11-15 MED ORDER — SODIUM CHLORIDE 0.9 % IV SOLN: INTRAVENOUS | Status: DC

## 2015-11-15 MED ORDER — SODIUM CHLORIDE 0.9 % IV SOLN
INTRAVENOUS | Status: DC
  Administered 2015-11-15 – 2015-11-18 (×5): via INTRAVENOUS

## 2015-11-15 MED ORDER — ENOXAPARIN SODIUM 40 MG/0.4ML ~~LOC~~ SOLN
40.0000 mg | SUBCUTANEOUS | Status: DC
  Administered 2015-11-15 – 2015-11-20 (×6): 40 mg via SUBCUTANEOUS
  Filled 2015-11-15 (×6): qty 0.4

## 2015-11-15 MED ORDER — SODIUM CHLORIDE 0.9 % IV SOLN
INTRAVENOUS | Status: DC
  Filled 2015-11-15: qty 2.5

## 2015-11-15 MED ORDER — PIPERACILLIN-TAZOBACTAM 3.375 G IVPB
3.3750 g | Freq: Once | INTRAVENOUS | Status: AC
  Administered 2015-11-15: 3.375 g via INTRAVENOUS
  Filled 2015-11-15: qty 50

## 2015-11-15 MED ORDER — ALBUTEROL SULFATE (2.5 MG/3ML) 0.083% IN NEBU: 2.5000 mg | INHALATION_SOLUTION | RESPIRATORY_TRACT | Status: DC | PRN

## 2015-11-15 MED ORDER — INSULIN ASPART 100 UNIT/ML ~~LOC~~ SOLN
0.0000 [IU] | Freq: Three times a day (TID) | SUBCUTANEOUS | Status: DC
  Administered 2015-11-16: 2 [IU] via SUBCUTANEOUS
  Administered 2015-11-16 – 2015-11-17 (×2): 3 [IU] via SUBCUTANEOUS
  Administered 2015-11-17 (×2): 2 [IU] via SUBCUTANEOUS
  Administered 2015-11-18: 9 [IU] via SUBCUTANEOUS
  Administered 2015-11-18: 2 [IU] via SUBCUTANEOUS
  Administered 2015-11-19: 3 [IU] via SUBCUTANEOUS
  Administered 2015-11-19: 1 [IU] via SUBCUTANEOUS
  Filled 2015-11-15: qty 3
  Filled 2015-11-15: qty 1
  Filled 2015-11-15: qty 2
  Filled 2015-11-15 (×2): qty 3
  Filled 2015-11-15: qty 2
  Filled 2015-11-15: qty 9
  Filled 2015-11-15: qty 10
  Filled 2015-11-15 (×2): qty 2

## 2015-11-15 MED ORDER — ONDANSETRON HCL 4 MG/2ML IJ SOLN: 4.0000 mg | Freq: Four times a day (QID) | INTRAMUSCULAR | Status: DC | PRN

## 2015-11-15 MED ORDER — DEXTROSE-NACL 5-0.45 % IV SOLN
INTRAVENOUS | Status: DC
  Administered 2015-11-15 (×2): via INTRAVENOUS

## 2015-11-15 MED ORDER — INSULIN GLARGINE 100 UNIT/ML ~~LOC~~ SOLN
14.0000 [IU] | Freq: Every day | SUBCUTANEOUS | Status: DC
  Administered 2015-11-15: 14 [IU] via SUBCUTANEOUS
  Filled 2015-11-15 (×2): qty 0.14

## 2015-11-15 MED ORDER — SODIUM CHLORIDE 0.9 % IV SOLN
Freq: Once | INTRAVENOUS | Status: AC
  Administered 2015-11-15: 10:00:00 via INTRAVENOUS

## 2015-11-15 MED ORDER — SODIUM CHLORIDE 0.9 % IV SOLN
Freq: Once | INTRAVENOUS | Status: AC
  Administered 2015-11-15: 12:00:00 via INTRAVENOUS

## 2015-11-15 MED ORDER — DEXTROSE 50 % IV SOLN
INTRAVENOUS | Status: AC
  Administered 2015-11-15: 17 mL
  Filled 2015-11-15: qty 50

## 2015-11-15 MED ORDER — SODIUM CHLORIDE 0.9 % IV SOLN
INTRAVENOUS | Status: DC
  Administered 2015-11-15: 15:00:00 via INTRAVENOUS

## 2015-11-15 MED ORDER — QUETIAPINE FUMARATE 25 MG PO TABS
50.0000 mg | ORAL_TABLET | Freq: Two times a day (BID) | ORAL | Status: DC
  Administered 2015-11-16 (×2): 50 mg via ORAL
  Filled 2015-11-15 (×2): qty 2

## 2015-11-15 NOTE — Progress Notes (Signed)
Advanced care planning  Patient reexamined.  Met with patient's mother and sister. Mother is his healthcare power of attorney.  Discussed regarding patient's acute illness and admission to ICU. Mother tells me that patient has suffered enough and would not want him to suffer further. She wants aggressive care at this point but no resuscitation or intubation. Sister was involved in the conization.  Time spent 20 minutes.

## 2015-11-15 NOTE — ED Provider Notes (Signed)
[facility] Emergency Department Provider Note    L5 caveat: Review of systems and history are limited by altered mental status  Time seen: ----------------------------------------- 10:12 AM on 11/15/2015 -----------------------------------------    I have reviewed the triage vital signs and the nursing notes.   HISTORY  Chief Complaint Hyperglycemia    HPI Martin Randolph is a 50 y.o. male who presents to the ER after arriving from the group home for lethargy, altered mental status and vomiting this morning. Patient blood sugar was noted to be in the 500s. Patient was responding to painful stimuli on arrival but nonverbal. He arrives looking pale. He was recently admitted for height low glycemia and caregiver states last night his blood sugars began running high. He was not feeling ill at that time, symptoms seem to start this morning.   Past Medical History:  Diagnosis Date  . Alzheimer's dementia   . Diabetes (HCC)   . Down syndrome     Patient Active Problem List   Diagnosis Date Noted  . Seizure (HCC)   . Hypoglycemia 10/29/2015  . Diabetes (HCC) 10/29/2015  . Down syndrome 10/29/2015    Past Surgical History:  Procedure Laterality Date  . DENTAL SURGERY      Allergies Review of patient's allergies indicates no known allergies.  Social History Social History  Substance Use Topics  . Smoking status: Never Smoker  . Smokeless tobacco: Never Used  . Alcohol use No    Review of Systems Unknown at this time, reportedly positive for vomiting, weakness, altered mental status  ____________________________________________   PHYSICAL EXAM:  VITAL SIGNS: ED Triage Vitals  Enc Vitals Group     BP 11/15/15 1000 (!) 67/32     Pulse Rate 11/15/15 1000 72     Resp 11/15/15 1000 18     Temp --      Temp src --      SpO2 11/15/15 1000 100 %     Weight 11/15/15 1001 120 lb (54.4 kg)     Height 11/15/15 1001 5\' 3"  (1.6 m)     Head  Circumference --      Peak Flow --      Pain Score --      Pain Loc --      Pain Edu? --      Excl. in GC? --     Constitutional: Patient is lethargic, responsive to painful stimuli. Eyes: Conjunctivae are normal. Pupils are 2 mm and equal bilaterally. Normal extraocular movements. ENT   Head: Normocephalic and atraumatic.   Nose: No congestion/rhinnorhea.   Mouth/Throat: Mucous membranes are moist.   Neck: No stridor. Cardiovascular: Normal rate, regular rhythm. Systolic murmur is noted Respiratory: Normal respiratory effort without tachypnea nor retractions. Breath sounds are clear and equal bilaterally. No wheezes/rales/rhonchi. Gastrointestinal: Soft and nontender. Normal bowel sounds Musculoskeletal: Nontender with normal range of motion in all extremities. No lower extremity tenderness nor edema. Neurologic:  Normal speech and language. Patient responsive to painful stimuli, does not cooperate with examination. Skin:  Skin is warm, dry and intact. Pallor is noted ____________________________________________  EKG: Interpreted by me. Sinus rhythm rate of 66 bpm, normal PR interval, wide QRS, normal QT, right bundle branch block,  ____________________________________________  ED COURSE:  Pertinent labs & imaging results that were available during my care of the patient were reviewed by me and considered in my medical decision making (see chart for details). Clinical Course  Comment By Time  Blood pressure is improving  with saline bolus. Emily FilbertJonathan E Keigan Girten, MD 10/12 1024  Patient presents the ER with hyperglycemia and altered mental status. We will assess with basic labs and imaging if needed. He was noted to be hypotensive on arrival, we will give IV fluids.  Procedures ____________________________________________   LABS (pertinent positives/negatives)  Labs Reviewed  CBC WITH DIFFERENTIAL/PLATELET - Abnormal; Notable for the following:       Result Value    RBC 3.81 (*)    MCV 104.9 (*)    MCH 35.2 (*)    RDW 14.7 (*)    Lymphs Abs 0.9 (*)    All other components within normal limits  COMPREHENSIVE METABOLIC PANEL - Abnormal; Notable for the following:    Sodium 133 (*)    Potassium 5.2 (*)    Chloride 96 (*)    CO2 15 (*)    Glucose, Bld 513 (*)    BUN 30 (*)    Creatinine, Ser 1.41 (*)    Total Bilirubin 1.4 (*)    GFR calc non Af Amer 57 (*)    Anion gap 22 (*)    All other components within normal limits  LACTIC ACID, PLASMA - Abnormal; Notable for the following:    Lactic Acid, Venous 4.4 (*)    All other components within normal limits  BLOOD GAS, VENOUS - Abnormal; Notable for the following:    pH, Ven 7.21 (*)    All other components within normal limits  GLUCOSE, CAPILLARY - Abnormal; Notable for the following:    Glucose-Capillary 509 (*)    All other components within normal limits  BETA-HYDROXYBUTYRIC ACID - Abnormal; Notable for the following:    Beta-Hydroxybutyric Acid 6.41 (*)    All other components within normal limits  GLUCOSE, CAPILLARY - Abnormal; Notable for the following:    Glucose-Capillary 425 (*)    All other components within normal limits  CULTURE, BLOOD (ROUTINE X 2)  CULTURE, BLOOD (ROUTINE X 2)  TROPONIN I  URINALYSIS COMPLETEWITH MICROSCOPIC ([facility] ONLY)  LACTIC ACID, PLASMA  CBG MONITORING, ED  CRITICAL CARE Performed by: Emily FilbertWilliams, Meiko Ives E   Total critical care time: 30 minutes  Critical care time was exclusive of separately billable procedures and treating other patients.  Critical care was necessary to treat or prevent imminent or life-threatening deterioration.  Critical care was time spent personally by me on the following activities: development of treatment plan with patient and/or surrogate as well as nursing, discussions with consultants, evaluation of patient's response to treatment, examination of patient, obtaining history from patient or surrogate, ordering and performing  treatments and interventions, ordering and review of laboratory studies, ordering and review of radiographic studies, pulse oximetry and re-evaluation of patient's condition.  ____________________________________________  FINAL ASSESSMENT AND PLAN  Hypotension, DKA, altered mental status, Lactic acidosis  Plan: Patient with labs and imaging as dictated above. Patient's blood pressure appears to have responded to fluid challenge. We have given him 3 L of normal saline as well as broad-spectrum antibiotics. He currently will be placed on an insulin drip for DKA. He may also be septic, we have ordered broad-spectrum antibiotics to cover for this. I discussed with critical care on call, recommend stepdown admission to the hospitalist service. Family is around, currently he is a full code but family states they would decide when they got here.   Emily FilbertWilliams, Karry Barrilleaux E, MD   Note: This dictation was prepared with Dragon dictation. Any transcriptional errors that result from this process are unintentional  Emily Filbert, MD 11/15/15 1247

## 2015-11-15 NOTE — ED Notes (Signed)
PICC team arrival is estimated for 14:15. MD notified.

## 2015-11-15 NOTE — ED Triage Notes (Signed)
Patient presents to the ED via car by group home staff.  Group home staff reports patient being more lethargic than normal this morning and vomiting x 3 as well as patient having blood sugar higher than 500.  Patient is responding to painful stimuli but not verbal stimuli.  Patient appears pale.  Respirations are even and not labored.  Skin is warm and dry.

## 2015-11-15 NOTE — H&P (Addendum)
[facility] [place] [facility]   PATIENT NAME: Martin Randolph    MR#:  [id number]  DATE OF BIRTH:  [birth year]  DATE OF ADMISSION:  11/15/2015  PRIMARY CARE PHYSICIAN: Pasty Spillers McLean-Scocuzza, MD   REQUESTING/REFERRING PHYSICIAN: Dr. Mayford Knife  CHIEF COMPLAINT:   Chief Complaint  Patient presents with  . Hyperglycemia    HISTORY OF PRESENT ILLNESS:  Martin Randolph  is a 50 y.o. male with a known history of IDDM, Jeral Pinch, Alziemers brought from group home due to Confusion, lethargy. Blood sugars were found to be elevated greater than 500. Here in the emergency room patient has been combative. Afebrile. Hypotensive initially into the 80s. Found to be in DKA with elevated lactic acid of 4.4.  He was recently in the hospital for hypoglycemia thought to be due to decreased oral intake. Insulin dose not changed. Today he is hyperglycemic.  No source of infection found. Urinalysis and chest x-ray pending.  Critically ill. Full code.  At baseline patient ambulates with assistance from staff. Nonverbal.  PAST MEDICAL HISTORY:   Past Medical History:  Diagnosis Date  . Alzheimer's dementia   . Diabetes (HCC)   . Down syndrome   Hypothyroidism  PAST SURGICAL HISTORY:   Past Surgical History:  Procedure Laterality Date  . DENTAL SURGERY      SOCIAL HISTORY:   Social History  Substance Use Topics  . Smoking status: Never Smoker  . Smokeless tobacco: Never Used  . Alcohol use No    FAMILY HISTORY:  History reviewed. No pertinent family history.  DM  DRUG ALLERGIES:  No Known Allergies  REVIEW OF SYSTEMS:   Review of Systems  Unable to perform ROS: Dementia    MEDICATIONS AT HOME:   Prior to Admission medications   Medication Sig Start Date End Date Taking? Authorizing Provider  acetaminophen (TYLENOL) 325 MG tablet Take 650 mg by mouth every 6 (six) hours as needed for mild pain.    Historical Provider, MD  carbamide peroxide  (DEBROX) 6.5 % otic solution Place 5-10 drops into both ears 2 (two) times daily.    Historical Provider, MD  cetaphil (CETAPHIL) lotion Apply 1 application topically 2 (two) times daily.    Historical Provider, MD  dextrose (GLUTOSE) 40 % GEL Take 37.5 g by mouth once as needed for low blood sugar. 11/03/15   Enedina Finner, MD  donepezil (ARICEPT) 10 MG tablet Take 10 mg by mouth at bedtime.    Historical Provider, MD  famotidine (PEPCID) 20 MG tablet Take 20 mg by mouth daily.    Historical Provider, MD  feeding supplement, GLUCERNA SHAKE, (GLUCERNA SHAKE) LIQD Take 237 mLs by mouth 3 (three) times daily between meals.    Historical Provider, MD  gabapentin (NEURONTIN) 400 MG capsule Take 400 mg by mouth at bedtime.    Historical Provider, MD  insulin aspart (NOVOLOG) 100 UNIT/ML injection Inject 5 Units into the skin 3 (three) times daily with meals. 11/03/15   Enedina Finner, MD  insulin glargine (LANTUS) 100 UNIT/ML injection Inject 0.14 mLs (14 Units total) into the skin at bedtime. 11/03/15   Enedina Finner, MD  levETIRAcetam (KEPPRA) 500 MG tablet Take 1 tablet (500 mg total) by mouth 2 (two) times daily. 11/03/15   Enedina Finner, MD  Melatonin 3 MG TABS Take 3 mg by mouth at bedtime.    Historical Provider, MD  metFORMIN (GLUCOPHAGE) 1000 MG tablet Take 1,000 mg by mouth 2 (two) times daily with a meal.  Historical Provider, MD  polyethylene glycol (MIRALAX / GLYCOLAX) packet Take 17 g by mouth every 3 (three) days.    Historical Provider, MD  QUEtiapine (SEROQUEL) 25 MG tablet Take 25 mg by mouth at bedtime.    Historical Provider, MD  QUEtiapine (SEROQUEL) 25 MG tablet Take 25 mg by mouth 2 (two) times daily as needed (agitation).    Historical Provider, MD  QUEtiapine (SEROQUEL) 50 MG tablet Take 50 mg by mouth 2 (two) times daily.    Historical Provider, MD  simvastatin (ZOCOR) 10 MG tablet Take 10 mg by mouth at bedtime.    Historical Provider, MD  tamsulosin (FLOMAX) 0.4 MG CAPS capsule Take 0.4 mg  by mouth at bedtime.    Historical Provider, MD     VITAL SIGNS:  Blood pressure 110/81, pulse 82, resp. rate 20, height 5\' 3"  (1.6 m), weight 54.4 kg (120 lb), SpO2 99 %.  PHYSICAL EXAMINATION:  Physical Exam  GENERAL:  50 y.o.-year-old patient lying in the bed with restless EYES: Pupils equal, round, reactive to light HEENT: Head atraumatic, normocephalic.  NECK:  Supple, no jugular venous distention. No thyroid enlargement. LUNGS: Normal breath sounds bilaterally, no wheezing, rales, rhonchi. No use of accessory muscles of respiration.  CARDIOVASCULAR: S1, S2 normal. No murmurs, rubs, or gallops.  ABDOMEN: Soft, nontender, nondistended. Bowel sounds present.  EXTREMITIES: No pedal edema, cyanosis, or clubbing. + 2 pedal & radial pulses b/l.   NEUROLOGIC: Moving all 4 extremities. Not following commands PSYCHIATRIC: The patient is drowzy  LABORATORY PANEL:   CBC  Recent Labs Lab 11/15/15 1011  WBC 6.7  HGB 13.4  HCT 40.0  PLT 262   ------------------------------------------------------------------------------------------------------------------  Chemistries   Recent Labs Lab 11/15/15 1011  NA 133*  K 5.2*  CL 96*  CO2 15*  GLUCOSE 513*  BUN 30*  CREATININE 1.41*  CALCIUM 9.4  AST 28  ALT 34  ALKPHOS 116  BILITOT 1.4*   ------------------------------------------------------------------------------------------------------------------  Cardiac Enzymes  Recent Labs Lab 11/15/15 1011  TROPONINI <0.03   ------------------------------------------------------------------------------------------------------------------  RADIOLOGY:  No results found.   IMPRESSION AND PLAN:   * DKA DKA protocol with IV, Accuchecks Q 1 hr. Insulin drip Q4 hr BMP Switch to D5-NS when BS <250 Can start lantus and stop insulin drip when BS<250, Bicarb>20 and AG normal.  * Sepsis? Meets criteria for SIRS. But no source. Normal WBC His lactic acid is likely due to  hypotension. Repeat lactic acid Check UA and CXR. Wait for cx. No further abx at this time. Will check pro calcitonin. Start Abx if elevated  * AKI Due to hypotension and dehydration. IV fluids. Monitor input and output and daily weights. Repeat labs in the morning.  * Acute encephalopathy over baseline dementia Due to acute illness Monitor  * Hypothyroidism Check TSH  * DVT prophylaxis  All the records are reviewed and case discussed with ED provider. Management plans discussed with the patient, family and they are in agreement.  CODE STATUS: FULL CODE  TOTAL CC TIME TAKING CARE OF THIS PATIENT: 60 minutes.   Milagros LollSudini, Carolynne Schuchard R M.D on 11/15/2015 at 1:13 PM  Between 7am to 6pm - Pager - [phone]  After 6pm go to [email or web address] - password [email or web address]  Fabio Neighb[facility] [place] [facility]  Office  [phone]  CC: Primary care physician; Pasty Spillersracy N McLean-Scocuzza, MD  Note: This dictation was prepared with Dragon dictation along with smaller phrase technology. Any transcriptional errors that result from this process are unintentional.

## 2015-11-16 ENCOUNTER — Encounter: Payer: Self-pay | Admitting: Nurse Practitioner

## 2015-11-16 DIAGNOSIS — R112 Nausea with vomiting, unspecified: Secondary | ICD-10-CM

## 2015-11-16 DIAGNOSIS — R739 Hyperglycemia, unspecified: Secondary | ICD-10-CM

## 2015-11-16 DIAGNOSIS — E1010 Type 1 diabetes mellitus with ketoacidosis without coma: Principal | ICD-10-CM

## 2015-11-16 DIAGNOSIS — I959 Hypotension, unspecified: Secondary | ICD-10-CM

## 2015-11-16 DIAGNOSIS — R001 Bradycardia, unspecified: Secondary | ICD-10-CM

## 2015-11-16 DIAGNOSIS — E43 Unspecified severe protein-calorie malnutrition: Secondary | ICD-10-CM

## 2015-11-16 LAB — GLUCOSE, CAPILLARY
GLUCOSE-CAPILLARY: 113 mg/dL — AB (ref 65–99)
GLUCOSE-CAPILLARY: 191 mg/dL — AB (ref 65–99)
GLUCOSE-CAPILLARY: 136 mg/dL — AB (ref 65–99)
Glucose-Capillary: 123 mg/dL — ABNORMAL HIGH (ref 65–99)
Glucose-Capillary: 37 mg/dL — CL (ref 65–99)
Glucose-Capillary: 104 mg/dL — ABNORMAL HIGH (ref 65–99)
Glucose-Capillary: 234 mg/dL — ABNORMAL HIGH (ref 65–99)

## 2015-11-16 LAB — COMPREHENSIVE METABOLIC PANEL
ALK PHOS: 64 U/L (ref 38–126)
ALT: 23 U/L (ref 17–63)
ANION GAP: 3 — AB (ref 5–15)
AST: 22 U/L (ref 15–41)
Albumin: 2.9 g/dL — ABNORMAL LOW (ref 3.5–5.0)
BUN: 12 mg/dL (ref 6–20)
CALCIUM: 8.2 mg/dL — AB (ref 8.9–10.3)
CHLORIDE: 111 mmol/L (ref 101–111)
CO2: 26 mmol/L (ref 22–32)
Creatinine, Ser: 0.64 mg/dL (ref 0.61–1.24)
GFR calc non Af Amer: >60 mL/min (ref >60)
Glucose, Bld: 60 mg/dL — ABNORMAL LOW (ref 65–99)
Potassium: 3.5 mmol/L (ref 3.5–5.1)
SODIUM: 140 mmol/L (ref 135–145)
Total Bilirubin: 0.6 mg/dL (ref 0.3–1.2)
Total Protein: 5.3 g/dL — ABNORMAL LOW (ref 6.5–8.1)

## 2015-11-16 LAB — BLOOD GAS, VENOUS
PCO2 VEN: 45 mmHg (ref 44.0–60.0)
PH VEN: 7.21 — AB (ref 7.250–7.430)
Patient temperature: 37

## 2015-11-16 LAB — CBC
HCT: 38.1 % — ABNORMAL LOW (ref 40.0–52.0)
HEMOGLOBIN: 13 g/dL (ref 13.0–18.0)
MCH: 35 pg — AB (ref 26.0–34.0)
MCHC: 34.1 g/dL (ref 32.0–36.0)
MCV: 102.7 fL — ABNORMAL HIGH (ref 80.0–100.0)
Platelets: 234 10*3/uL (ref 150–440)
RBC: 3.71 MIL/uL — AB (ref 4.40–5.90)
RDW: 14.1 % (ref 11.5–14.5)
WBC: 9.5 10*3/uL (ref 3.8–10.6)

## 2015-11-16 LAB — HEMOGLOBIN A1C
Hgb A1c MFr Bld: 8.4 % — ABNORMAL HIGH (ref 4.8–5.6)
Mean Plasma Glucose: 194 mg/dL

## 2015-11-16 LAB — PROCALCITONIN

## 2015-11-16 LAB — POTASSIUM: Potassium: 4.4 mmol/L (ref 3.5–5.1)

## 2015-11-16 MED ORDER — DEXTROSE 50 % IV SOLN
INTRAVENOUS | Status: AC
  Administered 2015-11-16: 08:00:00
  Filled 2015-11-16: qty 50

## 2015-11-16 MED ORDER — POTASSIUM CHLORIDE 10 MEQ/100ML IV SOLN
10.0000 meq | INTRAVENOUS | Status: AC
  Administered 2015-11-16 (×4): 10 meq via INTRAVENOUS
  Filled 2015-11-16 (×4): qty 100

## 2015-11-16 MED ORDER — SODIUM CHLORIDE 0.9 % IV BOLUS (SEPSIS)
500.0000 mL | Freq: Once | INTRAVENOUS | Status: AC
  Administered 2015-11-16: 500 mL via INTRAVENOUS

## 2015-11-16 MED ORDER — INSULIN ASPART 100 UNIT/ML ~~LOC~~ SOLN
3.0000 [IU] | Freq: Three times a day (TID) | SUBCUTANEOUS | Status: DC
  Administered 2015-11-16 – 2015-11-20 (×9): 3 [IU] via SUBCUTANEOUS
  Filled 2015-11-16 (×9): qty 3

## 2015-11-16 MED ORDER — INSULIN GLARGINE 100 UNIT/ML ~~LOC~~ SOLN
12.0000 [IU] | Freq: Every day | SUBCUTANEOUS | Status: DC
  Administered 2015-11-16: 12 [IU] via SUBCUTANEOUS
  Filled 2015-11-16 (×2): qty 0.12

## 2015-11-16 MED ORDER — GLUCERNA SHAKE PO LIQD
237.0000 mL | Freq: Three times a day (TID) | ORAL | Status: DC
  Administered 2015-11-16 – 2015-11-20 (×10): 237 mL via ORAL

## 2015-11-16 MED ORDER — ORAL CARE MOUTH RINSE
15.0000 mL | Freq: Two times a day (BID) | OROMUCOSAL | Status: DC
  Administered 2015-11-16 – 2015-11-19 (×5): 15 mL via OROMUCOSAL

## 2015-11-16 NOTE — Progress Notes (Signed)
CSW is familiar with patient due to past hospitalization. CSW called [name] with [name]. No answer. Awaiting phone call back.  [name], MSW, LCSW, LCAS-A Clinical Social Worker [phone]

## 2015-11-16 NOTE — Progress Notes (Signed)
BP soft and trending down.  Spoke with Dr Emmit PomfretHugelmeyer about same.  Received order for bolus.  Will continue to monitor.

## 2015-11-16 NOTE — Progress Notes (Signed)
Patient has been aggressive and fighting against his mother and the aide for the last hour.  Serious concern that he will remove his picc line.  Line is secured with kerliz and threated through the top of his gown and then placed under a pillow at his to prevent this.  Ativan 1 mg. Given slow IVP.

## 2015-11-16 NOTE — Progress Notes (Signed)
Patient admitted from the CCU.  Mother is at the bedside. He is agitated.  Calmed immediately when she started feeding him lunch.  1 st of 4 potassium IVPB initiated.

## 2015-11-16 NOTE — Progress Notes (Signed)
Initial Nutrition Assessment  DOCUMENTATION CODES:   Severe malnutrition in context of acute illness/injury  INTERVENTION:  -Recommend Glucerna Shake po TID between meals, each supplement provides 220 kcal and 10 grams of protein -Agree with smaller, more frequent meals. Pt to receive Glucerna between meals at present, can adjust as needed. Insulin regimen may need to be adjusted with addition of snacks -Recommend Dysphagia III, Carb Modified Diet as pt with difficulty chewing   NUTRITION DIAGNOSIS:   Malnutrition related to acute illness as evidenced by percent weight loss, moderate depletions of muscle mass, moderate depletion of body fat.  GOAL:   Patient will meet greater than or equal to 90% of their needs  MONITOR:   PO intake, Supplement acceptance, Labs, Weight trends  REASON FOR ASSESSMENT:   Malnutrition Screening Tool    ASSESSMENT:   50 yo male admitted with DKA, AKI, acute encephalopathy with baseline dementia, down syndrome. Pt nonverbal at baseline  Mother at bedside. Mother reports pt with good appetite since discharge but expresses concern with regards to the portion sizes at the group home. Mother reports pt eats 100% of his meals but the size of the meals is very small. Mother reports 10 pound wt loss in 1 month; 7.9% wt loss. Noted pt with hx of hypoglycemic episodes which have been attributed to poor oral intake. Mother also reports difficulty chewing and needs foods to be chopped. All meats need to be chopped, fresh fruit cup at bedside today and mother reporting piece of fruit are too big and pt would do better with canned fruit cocktail  Nutrition-Focused physical exam completed. Findings are moderate fat depletion, moderate muscle depletion, and no edema.    Past Medical History:  Diagnosis Date  . Alzheimer's dementia   . Diabetes (HCC)   . Down syndrome   . Hyperlipidemia   . Hypothyroidism   . Nonverbal    a. Since age [age] following fall with  skull fracture.  . Seizures (HCC)     Diet Order:  DIET DYS 3 Room service appropriate? Yes with Assist; Fluid consistency: Thin  Skin:   (stage I buttock)  Last BM:  no documented BM since admission   Labs:  Glucose Profile:   Recent Labs  11/16/15 0845 11/16/15 1107 11/16/15 1201  GLUCAP 104* 113* 234*   Meds: NS at 100 ml/hr, ss novolog, novolog with meals, lantus  Height:   Ht Readings from Last 1 Encounters:  11/15/15 5\' 3"  (1.6 m)    Weight:   Wt Readings from Last 1 Encounters:  11/16/15 116 lb 10 oz (52.9 kg)    BMI:  Body mass index is 20.66 kg/m.  Estimated Nutritional Needs:   Kcal:  1600-1855 kcals  Protein:  64-75 g  Fluid:  >/= 1.6 L  EDUCATION NEEDS:   No education needs identified at this time  Martin StarcherCate Michalla Ringer MS, RD, LDN [phone] Pager  [phone] Weekend/On-Call Pager

## 2015-11-16 NOTE — Progress Notes (Addendum)
MEDICATION RELATED CONSULT NOTE - INITIAL   Pharmacy Consult for Electrolyte management  Indication: DKA patient  No Known Allergies  Patient Measurements: Height: 5\' 3"  (160 cm) Weight: 116 lb 10 oz (52.9 kg) IBW/kg (Calculated) : 56.9 Adjusted Body Weight:   Vital Signs: Temp: 98.6 F (37 C) (10/13 1937) Temp Source: Oral (10/13 1937) BP: 100/75 (10/13 1937) Pulse Rate: 80 (10/13 1937) Intake/Output from previous day: 10/12 0701 - 10/13 0700 In: 873.3 [I.V.:873.3] Out: -  Intake/Output from this shift: No intake/output data recorded.  Labs:  Recent Labs  11/15/15 1011 11/15/15 1939 11/16/15 0426  WBC 6.7  --  9.5  HGB 13.4  --  13.0  HCT 40.0  --  38.1*  PLT 262  --  234  CREATININE 1.41* 0.82 0.64  ALBUMIN 3.6  --  2.9*  PROT 6.7  --  5.3*  AST 28  --  22  ALT 34  --  23  ALKPHOS 116  --  64  BILITOT 1.4*  --  0.6   Estimated Creatinine Clearance: 82.7 mL/min (by C-G formula based on SCr of 0.64 mg/dL).   Microbiology: Recent Results (from the past 720 hour(s))  MRSA PCR Screening     Status: None   Collection Time: 10/30/15  4:59 AM  Result Value Ref Range Status   MRSA by PCR NEGATIVE NEGATIVE Final    Comment:        The GeneXpert MRSA Assay (FDA approved for NASAL specimens only), is one component of a comprehensive MRSA colonization surveillance program. It is not intended to diagnose MRSA infection nor to guide or monitor treatment for MRSA infections.   Blood culture (routine x 2)     Status: None (Preliminary result)   Collection Time: 11/15/15 11:51 AM  Result Value Ref Range Status   Specimen Description BLOOD LEFT FOREARM  Final   Special Requests   Final    BOTTLES DRAWN AEROBIC AND ANAEROBIC AER ANA   Culture NO GROWTH < 24 HOURS  Final   Report Status PENDING  Incomplete  Blood culture (routine x 2)     Status: None (Preliminary result)   Collection Time: 11/15/15 11:51 AM  Result Value Ref Range Status   Specimen  Description BLOOD  RIGHT FOREARM  Final   Special Requests   Final    BOTTLES DRAWN AEROBIC AND ANAEROBIC  AER ANA   Culture NO GROWTH < 24 HOURS  Final   Report Status PENDING  Incomplete  MRSA PCR Screening     Status: None   Collection Time: 11/15/15  4:19 PM  Result Value Ref Range Status   MRSA by PCR NEGATIVE NEGATIVE Final    Comment:        The GeneXpert MRSA Assay (FDA approved for NASAL specimens only), is one component of a comprehensive MRSA colonization surveillance program. It is not intended to diagnose MRSA infection nor to guide or monitor treatment for MRSA infections.     Medical History: Past Medical History:  Diagnosis Date  . Alzheimer's dementia   . Diabetes (HCC)   . Down syndrome   . Hyperlipidemia   . Hypothyroidism   . Nonverbal    a. Since age [age] following fall with skull fracture.  . Seizures (HCC)     Medications:  Prescriptions Prior to Admission  Medication Sig Dispense Refill Last Dose  . acetaminophen (TYLENOL) 325 MG tablet Take 650 mg by mouth every 6 (six) hours as  needed for mild pain.   prn at prn  . carbamide peroxide (DEBROX) 6.5 % otic solution Place 5-10 drops into both ears 2 (two) times daily.   10/29/2015 at Unknown time  . cetaphil (CETAPHIL) lotion Apply 1 application topically 2 (two) times daily.   10/29/2015 at Unknown time  . dextrose (GLUTOSE) 40 % GEL Take 37.5 g by mouth once as needed for low blood sugar. 1 Tube 2   . donepezil (ARICEPT) 10 MG tablet Take 10 mg by mouth at bedtime.   Past Week at Unknown time  . famotidine (PEPCID) 20 MG tablet Take 20 mg by mouth daily.   10/28/2015 at Unknown time  . feeding supplement, GLUCERNA SHAKE, (GLUCERNA SHAKE) LIQD Take 237 mLs by mouth 3 (three) times daily between meals.   10/29/2015 at Unknown time  . gabapentin (NEURONTIN) 400 MG capsule Take 400 mg by mouth at bedtime.   10/28/2015 at Unknown time  . insulin aspart (NOVOLOG) 100 UNIT/ML injection Inject 5 Units  into the skin 3 (three) times daily with meals. 10 mL 11   . insulin glargine (LANTUS) 100 UNIT/ML injection Inject 0.14 mLs (14 Units total) into the skin at bedtime. 10 mL 11   . levETIRAcetam (KEPPRA) 500 MG tablet Take 1 tablet (500 mg total) by mouth 2 (two) times daily. 60 tablet 3   . Melatonin 3 MG TABS Take 3 mg by mouth at bedtime.   10/28/2015 at Unknown time  . metFORMIN (GLUCOPHAGE) 1000 MG tablet Take 1,000 mg by mouth 2 (two) times daily with a meal.   10/28/2015 at Unknown time  . polyethylene glycol (MIRALAX / GLYCOLAX) packet Take 17 g by mouth every 3 (three) days.   Past Month at Unknown time  . QUEtiapine (SEROQUEL) 25 MG tablet Take 25 mg by mouth at bedtime.   Past Week at Unknown time  . QUEtiapine (SEROQUEL) 25 MG tablet Take 25 mg by mouth 2 (two) times daily as needed (agitation).   prn at prn  . QUEtiapine (SEROQUEL) 50 MG tablet Take 50 mg by mouth 2 (two) times daily.   10/29/2015 at Unknown time  . simvastatin (ZOCOR) 10 MG tablet Take 10 mg by mouth at bedtime.   10/28/2015 at Unknown time  . tamsulosin (FLOMAX) 0.4 MG CAPS capsule Take 0.4 mg by mouth at bedtime.   10/28/2015 at Unknown time   Scheduled:  . enoxaparin (LOVENOX) injection  40 mg Subcutaneous Q24H  . feeding supplement (GLUCERNA SHAKE)  237 mL Oral TID BM  . gabapentin  400 mg Oral QHS  . insulin aspart  0-9 Units Subcutaneous TID WC  . insulin aspart  3 Units Subcutaneous TID WC  . insulin glargine  12 Units Subcutaneous QHS  . levETIRAcetam  500 mg Oral BID  . mouth rinse  15 mL Mouth Rinse BID  . QUEtiapine  50 mg Oral BID  . sodium chloride flush  3 mL Intravenous Q12H  . tamsulosin  0.4 mg Oral QHS    Assessment: Pharmacy consulted to manage electrolytes in this DKA patient while patient is on Insulin gtt.   Goal of Therapy:  K >4.0   Plan:  Currently K = 3.5; therefore will supplement. Will give KCl 10 mEq IV x 4 doses. Will recheck K @ 20:00.   10/13:  K @ 20:21 = 4.4   10/14 0520  K 3.8. Will give potassium chloride 20 mEq PO BID with meals x 2 doses and recheck electrolytes tomorrow  with AM labs. Orlando PennerN. Shigeru Lampert, Pharm.D.  Robbins,Jason D 11/16/2015,8:58 PM

## 2015-11-16 NOTE — Consult Note (Signed)
Cardiology Consult    Patient ID: Martin Randolph MRN: [id number], DOB/AGE: 1965-05-24   Admit date: 11/15/2015 Date of Consult: 11/16/2015  Primary Physician: Pasty Spillers McLean-Scocuzza, MD Primary Cardiologist: new - seen by Concha Se, MD  Requesting Provider: D. Hower, MD  Patient Profile    50 y/o ? with a h/o DM, down syndrome complicated by dementia and nonverbal state, HL, hypothyroidism, and seizures who was admitted 10/12 with hyperglycemia/DKA and noted to be bradycardic on the morning of 10/13 in the setting of hypoglycemia (BG 37).  Past Medical History   Past Medical History:  Diagnosis Date  . Alzheimer's dementia   . Diabetes (HCC)   . Down syndrome   . Hyperlipidemia   . Hypothyroidism   . Nonverbal    a. Since age [age] following fall with skull fracture.  . Seizures (HCC)     Past Surgical History:  Procedure Laterality Date  . DENTAL SURGERY       Allergies  No Known Allergies  History of Present Illness    50 y/o ? with the above complex PMH including down syndrome, adult onset IDDM, dementia, seizures, and nonverbal state since the age of [age] following fall and skull fx.  He has no prior cardiac hx.  His mother is with him today and provides much of the history.  She says that he was admitted to [facility] about 2 mos ago related after coming down with the flu.  During admission, she was apparently told that his HR was low and an ecg was performed.  It was not felt that he required any further evaluation.  Of note, there is no record of this admission in care everywhere.    He lives locally in a group home.  His mother is very involved in his care.  He was admitted to [facility] in late Sept with hypoglycemia.  His insulin was adjusted and prior to d/c he was noted to have sz activity.  He was seen by neuro and prior anticonvulsants were continued.  On 10/12, he was noted to be confused, lethargic, and vomited.  BG was > 500.  He was taken to the [facility] ED where he was  combative and hypotensive.  Lactic acid was elevated @ 4.4.  He was admitted to ICU for mgmt of DKA.  This AM between 6a and 6:15a, he was noted to be bradycardic with rates in the 40's.  Per his mother, he was awake and shaking some, which he often does when he is hypoglycemic.  BG was registered @ 37.  BP was in the 80's.  He was subsequently given D50 with improvement in BG, HR, BP.  We've been asked to eval.  Pt is currently resting quietly with his eyes closed/sleeping.  Per mother, no prior h/o chest pain, dyspnea, or syncope.  Inpatient Medications    . enoxaparin (LOVENOX) injection  40 mg Subcutaneous Q24H  . gabapentin  400 mg Oral QHS  . insulin aspart  0-9 Units Subcutaneous TID WC  . insulin aspart  5 Units Subcutaneous TID WC  . insulin glargine  14 Units Subcutaneous QHS  . levETIRAcetam  500 mg Oral BID  . mouth rinse  15 mL Mouth Rinse BID  . potassium chloride  10 mEq Intravenous Q1 Hr x 4  . QUEtiapine  50 mg Oral BID  . sodium chloride flush  3 mL Intravenous Q12H  . tamsulosin  0.4 mg Oral QHS    Family History - Pt is  nonverbal and currently sleeping - FH obtained from mother, who is @ bedside.   History reviewed. No pertinent family history.  Social History - Pt is nonverbal and currently sleeping - SH obtained from mother, who is @ bedside.    Social History   Social History  . Marital status: Single    Spouse name: N/A  . Number of children: N/A  . Years of education: N/A   Occupational History  . Not on file.   Social History Main Topics  . Smoking status: Never Smoker  . Smokeless tobacco: Never Used  . Alcohol use No  . Drug use: No  . Sexual activity: Not on file   Other Topics Concern  . Not on file   Social History Narrative   Lives locally in group home.  Mother is very involved in his life.     Review of Systems - Pt is nonverbal and currently sleeping - unable to provide ROS.     Physical Exam    Blood pressure 103/82, pulse 69,  temperature 97.2 F (36.2 C), temperature source Axillary, resp. rate 17, height 5\' 3"  (1.6 m), weight 116 lb 10 oz (52.9 kg), SpO2 99 %.  General: Resting with eyes closed.  Does not open eyes for examination. Psych: Resting with eyes closed - does not respond to verbal or tactile stimuli currently. Neuro: Resting with eyes closed - does not respond to verbal or tactile stimuli currently. HEENT: Normal  Neck: Supple without bruits or JVD. Lungs:  Resp regular and unlabored, CTA. Heart: RRR no s3, s4, or murmurs. Abdomen: Soft, non-tender, non-distended, BS + x 4.  Extremities: No clubbing, cyanosis or edema. DP/PT/Radials 2+ and equal bilaterally.  Labs     Recent Labs  11/15/15 1011  TROPONINI <0.03   Lab Results  Component Value Date   WBC 9.5 11/16/2015   HGB 13.0 11/16/2015   HCT 38.1 (L) 11/16/2015   MCV 102.7 (H) 11/16/2015   PLT 234 11/16/2015     Recent Labs Lab 11/16/15 0426  NA 140  K 3.5  CL 111  CO2 26  BUN 12  CREATININE 0.64  CALCIUM 8.2*  PROT 5.3*  BILITOT 0.6  ALKPHOS 64  ALT 23  AST 22  GLUCOSE 60*    Radiology Studies    Ct Head Wo Contrast  Result Date: 10/31/2015 CLINICAL DATA:  Tonic clonic seizure this morning. Pt was scanned in a left lateral position. Pt was moving around on the table between the topogram and th scan. EXAM: CT HEAD WITHOUT CONTRAST TECHNIQUE: Contiguous axial images were obtained from the base of the skull through the vertex without intravenous contrast. COMPARISON:  None. FINDINGS: Brain: The ventricles are enlarged, along with the sulci, consistent with advanced atrophy. No convincing hydrocephalus. There is white matter hypoattenuation with relative cortical thinning along the anterior frontal lobes without a convincing infarct. This could be related to old trauma. There are no parenchymal masses or mass effect. There is no evidence of a recent cortical infarct. There is a sliver of hypo attenuating fluid along the right  cerebrum, higher attenuation than CSF. This is consistent with a small chronic subdural hemorrhage, measuring a maximum of approximately 5 mm in thickness. There is no significant mass effect or midline shift. There is no acute intracranial hemorrhage. Vascular: No hyperdense vessel or unexpected calcification. Skull: Normal. Negative for fracture or focal lesion. Sinuses/Orbits: No acute finding. Other: None IMPRESSION: 1. Small right-sided chronic subdural hemorrhage with no significant  mass effect. 2. No acute intracranial abnormalities. No evidence of a recent infarct or of intracranial hemorrhage. 3. Advanced atrophy for age. Electronically Signed   By: Amie Portlandavid  Ormond M.D.   On: 10/31/2015 14:43   Dg Chest Port 1 View  Result Date: 11/15/2015 CLINICAL DATA:  Central line placement EXAM: PORTABLE CHEST 1 VIEW COMPARISON:  None. FINDINGS: Bilateral diffuse interstitial thickening. There is no focal parenchymal opacity. There is no pleural effusion or pneumothorax. The heart and mediastinal contours are unremarkable. Left-sided PICC line with the tip projecting over the cavoatrial junction. The osseous structures are unremarkable. IMPRESSION: Left-sided PICC line with the tip projecting over the cavoatrial junction. Bilateral interstitial thickening concerning for interstitial edema. Electronically Signed   By: Elige KoHetal  Patel   On: 11/15/2015 15:04    ECG & Cardiac Imaging    RSR, 66, inc rbbb, rightward axis, lpfb, lae - no old for comparison.  Assessment & Plan    1. Sinus bradycardia:  Pt noted to develop asymptomatic sinus bradycardia with mild hypotension in the setting of hypoglycemia this AM.  HR's down into the mid-40's @ the time.  HR's improved with correction of BG.  No evidence of any degree of heart block at baseline or during bradycardic/hypoglycemic episode.  QT did prolong with hypoglycemia (~ 420 @ baseline, out to 460 during period of hypoglycemia/bradycardia).  Both bradycardia and QT  prolongation are known to occur during hypoglycemia, especially in the setting of hypokalemia (K+ 3.5 this AM) and could potentially be pro-arrhythmic (torsades).  As such, avoidance of hypoglycemia and correction of K+ is important to prevent future episodes.  In the absence of high grade HB, ongoing symptomatic or hemodynamically unstable bradycardia, he does not require any further w/u at this point. No indication for pacing.  2.  DKA:  Mgmt per IM.  Avoid hypoglycemia.  3.  Hypothyroidism:  This is listed in PMH from ER visit @ [facility] in March, though it does not appear that he is on replacement @ home.  TSH 14.563 this admission.  Further w/u and mgmt per IM.  4.  Down Syndrome/Dementia:  Currently calm/resting.  Lives in group home.  5.  HL:  On low dose simva @ home in setting of DM.  6.  Hypokalemia: Supplementation ordered.  Signed, Nicolasa Duckinghristopher Rekita Miotke, NP 11/16/2015, 12:09 PM

## 2015-11-16 NOTE — Progress Notes (Signed)
[facility] - [place] at [facility]   PATIENT NAME: Martin Randolph    MRN#:  [id number]  DATE OF BIRTH:  1965-08-04  SUBJECTIVE:  Hospital Day: 1 day Martin Randolph is a 50 y.o. male presenting with Hyperglycemia .   Overnight events: transitioned off of insulin drip hypoglycemic this morning Interval Events: patient hypoglycemic hypotensive bradycardic, stabilizedunable to answer questions given mental status, mother at bedside  REVIEW OF SYSTEMS:  Unable to obtain given mental statu  DRUG ALLERGIES:  No Known Allergies  VITALS:  Blood pressure 103/82, pulse 69, temperature 97.2 F (36.2 C), temperature source Axillary, resp. rate 17, height 5\' 3"  (1.6 m), weight 52.9 kg (116 lb 10 oz), SpO2 99 %.  PHYSICAL EXAMINATION:   VITAL SIGNS: Vitals:   11/16/15 1000 11/16/15 1100  BP: 95/81 103/82  Pulse: (!) 123 69  Resp: 18 17  Temp:     GENERAL:49 y.o.male moderate distress given mental status.  HEAD: Normocephalic, atraumatic.  EYES: Pupils equal, round, reactive to light. Unable to assess extraocular muscles given mental status/medical condition. No scleral icterus.  MOUTH: Moist mucosal membrane. Dentition intact. No abscess noted.  EAR, NOSE, THROAT: Clear without exudates. No external lesions.  NECK: Supple. No thyromegaly. No nodules. No JVD.  PULMONARY: Clear to ascultation, without wheeze rails or rhonci. No use of accessory muscles, Good respiratory effort. good air entry bilaterally CHEST: Nontender to palpation.  CARDIOVASCULAR: S1 and S2. Bradycardic No murmurs, rubs, or gallops. No edema. Pedal pulses 2+ bilaterally.  GASTROINTESTINAL: Soft, nontender, nondistended. No masses. Positive bowel sounds. No hepatosplenomegaly.  MUSCULOSKELETAL: No swelling, clubbing, or edema. Range of motion full in all extremities.  NEUROLOGIC: Unable to assess given mental status/medical condition SKIN: No ulceration, lesions, rashes, or cyanosis. Skin warm  and dry. Turgor intact.  PSYCHIATRIC: Unable to assess given mental status/medical condition      LABORATORY PANEL:   CBC  Recent Labs Lab 11/16/15 0426  WBC 9.5  HGB 13.0  HCT 38.1*  PLT 234   ------------------------------------------------------------------------------------------------------------------  Chemistries   Recent Labs Lab 11/16/15 0426  NA 140  K 3.5  CL 111  CO2 26  GLUCOSE 60*  BUN 12  CREATININE 0.64  CALCIUM 8.2*  AST 22  ALT 23  ALKPHOS 64  BILITOT 0.6   ------------------------------------------------------------------------------------------------------------------  Cardiac Enzymes  Recent Labs Lab 11/15/15 1011  TROPONINI <0.03   ------------------------------------------------------------------------------------------------------------------  RADIOLOGY:  Dg Chest Port 1 View  Result Date: 11/15/2015 CLINICAL DATA:  Central line placement EXAM: PORTABLE CHEST 1 VIEW COMPARISON:  None. FINDINGS: Bilateral diffuse interstitial thickening. There is no focal parenchymal opacity. There is no pleural effusion or pneumothorax. The heart and mediastinal contours are unremarkable. Left-sided PICC line with the tip projecting over the cavoatrial junction. The osseous structures are unremarkable. IMPRESSION: Left-sided PICC line with the tip projecting over the cavoatrial junction. Bilateral interstitial thickening concerning for interstitial edema. Electronically Signed   By: Elige Ko   On: 11/15/2015 15:04    EKG:  No orders found for this or any previous visit.  ASSESSMENT AND PLAN:   Tahjay Binion is a 50 y.o. male presenting with Hyperglycemia . Admitted 11/15/2015 : Day #: 1 day 1.anion gap metabolic acidosis resolved Lactic acidosis vs  DKA:, he was hyperglycemic with increased anion gap treated with IV insulin transitioned off now back to basal insulin coverage  2. Bradycardia: Hold any AV nodal agents consult  cardiology  Disposition:table to transfer out of intensive care unit  All the records are reviewed and case discussed with Care Management/Social Workerr. Management plans discussed with the patient, family and they are in agreement.  CODE STATUS: dnr TOTAL TIME TAKING CARE OF THIS PATIENT: 28 minutes.   POSSIBLE D/C IN 1-2DAYS, DEPENDING ON CLINICAL CONDITION.   Martin Randolph,  Mardi MainlandDavid K M.D on 11/16/2015 at 11:28 AM  Between 7am to 6pm - Pager - [phone]  After 6pm: House Pager: - [phone]  [facility]  Office  [phone]  CC: Primary care physician; Pasty Spillersracy N McLean-Scocuzza, MD

## 2015-11-16 NOTE — Progress Notes (Signed)
MEDICATION RELATED CONSULT NOTE - INITIAL   Pharmacy Consult for Electrolyte management  Indication: DKA patient  No Known Allergies  Patient Measurements: Height: 5\' 3"  (160 cm) Weight: 116 lb 10 oz (52.9 kg) IBW/kg (Calculated) : 56.9 Adjusted Body Weight:   Vital Signs: Temp: 97.2 F (36.2 C) (10/13 0800) Temp Source: Axillary (10/13 0800) BP: 103/82 (10/13 1100) Pulse Rate: 69 (10/13 1100) Intake/Output from previous day: 10/12 0701 - 10/13 0700 In: 873.3 [I.V.:873.3] Out: -  Intake/Output from this shift: Total I/O In: 3 [I.V.:3] Out: -   Labs:  Recent Labs  11/15/15 1011 11/15/15 1939 11/16/15 0426  WBC 6.7  --  9.5  HGB 13.4  --  13.0  HCT 40.0  --  38.1*  PLT 262  --  234  CREATININE 1.41* 0.82 0.64  ALBUMIN 3.6  --  2.9*  PROT 6.7  --  5.3*  AST 28  --  22  ALT 34  --  23  ALKPHOS 116  --  64  BILITOT 1.4*  --  0.6   Estimated Creatinine Clearance: 82.7 mL/min (by C-G formula based on SCr of 0.64 mg/dL).   Microbiology: Recent Results (from the past 720 hour(s))  MRSA PCR Screening     Status: None   Collection Time: 10/30/15  4:59 AM  Result Value Ref Range Status   MRSA by PCR NEGATIVE NEGATIVE Final    Comment:        The GeneXpert MRSA Assay (FDA approved for NASAL specimens only), is one component of a comprehensive MRSA colonization surveillance program. It is not intended to diagnose MRSA infection nor to guide or monitor treatment for MRSA infections.   Blood culture (routine x 2)     Status: None (Preliminary result)   Collection Time: 11/15/15 11:51 AM  Result Value Ref Range Status   Specimen Description BLOOD LEFT FOREARM  Final   Special Requests   Final    BOTTLES DRAWN AEROBIC AND ANAEROBIC AER 9ML ANA 9ML   Culture NO GROWTH < 24 HOURS  Final   Report Status PENDING  Incomplete  Blood culture (routine x 2)     Status: None (Preliminary result)   Collection Time: 11/15/15 11:51 AM  Result Value Ref Range Status   Specimen Description BLOOD  RIGHT FOREARM  Final   Special Requests   Final    BOTTLES DRAWN AEROBIC AND ANAEROBIC  AER 8ML ANA 7ML   Culture NO GROWTH < 24 HOURS  Final   Report Status PENDING  Incomplete  MRSA PCR Screening     Status: None   Collection Time: 11/15/15  4:19 PM  Result Value Ref Range Status   MRSA by PCR NEGATIVE NEGATIVE Final    Comment:        The GeneXpert MRSA Assay (FDA approved for NASAL specimens only), is one component of a comprehensive MRSA colonization surveillance program. It is not intended to diagnose MRSA infection nor to guide or monitor treatment for MRSA infections.     Medical History: Past Medical History:  Diagnosis Date  . Alzheimer's dementia   . Diabetes (HCC)   . Down syndrome     Medications:  Prescriptions Prior to Admission  Medication Sig Dispense Refill Last Dose  . acetaminophen (TYLENOL) 325 MG tablet Take 650 mg by mouth every 6 (six) hours as needed for mild pain.   prn at prn  . carbamide peroxide (DEBROX) 6.5 % otic solution Place 5-10 drops into both ears 2 (  two) times daily.   10/29/2015 at Unknown time  . cetaphil (CETAPHIL) lotion Apply 1 application topically 2 (two) times daily.   10/29/2015 at Unknown time  . dextrose (GLUTOSE) 40 % GEL Take 37.5 g by mouth once as needed for low blood sugar. 1 Tube 2   . donepezil (ARICEPT) 10 MG tablet Take 10 mg by mouth at bedtime.   Past Week at Unknown time  . famotidine (PEPCID) 20 MG tablet Take 20 mg by mouth daily.   10/28/2015 at Unknown time  . feeding supplement, GLUCERNA SHAKE, (GLUCERNA SHAKE) LIQD Take 237 mLs by mouth 3 (three) times daily between meals.   10/29/2015 at Unknown time  . gabapentin (NEURONTIN) 400 MG capsule Take 400 mg by mouth at bedtime.   10/28/2015 at Unknown time  . insulin aspart (NOVOLOG) 100 UNIT/ML injection Inject 5 Units into the skin 3 (three) times daily with meals. 10 mL 11   . insulin glargine (LANTUS) 100 UNIT/ML injection Inject 0.14  mLs (14 Units total) into the skin at bedtime. 10 mL 11   . levETIRAcetam (KEPPRA) 500 MG tablet Take 1 tablet (500 mg total) by mouth 2 (two) times daily. 60 tablet 3   . Melatonin 3 MG TABS Take 3 mg by mouth at bedtime.   10/28/2015 at Unknown time  . metFORMIN (GLUCOPHAGE) 1000 MG tablet Take 1,000 mg by mouth 2 (two) times daily with a meal.   10/28/2015 at Unknown time  . polyethylene glycol (MIRALAX / GLYCOLAX) packet Take 17 g by mouth every 3 (three) days.   Past Month at Unknown time  . QUEtiapine (SEROQUEL) 25 MG tablet Take 25 mg by mouth at bedtime.   Past Week at Unknown time  . QUEtiapine (SEROQUEL) 25 MG tablet Take 25 mg by mouth 2 (two) times daily as needed (agitation).   prn at prn  . QUEtiapine (SEROQUEL) 50 MG tablet Take 50 mg by mouth 2 (two) times daily.   10/29/2015 at Unknown time  . simvastatin (ZOCOR) 10 MG tablet Take 10 mg by mouth at bedtime.   10/28/2015 at Unknown time  . tamsulosin (FLOMAX) 0.4 MG CAPS capsule Take 0.4 mg by mouth at bedtime.   10/28/2015 at Unknown time   Scheduled:  . enoxaparin (LOVENOX) injection  40 mg Subcutaneous Q24H  . gabapentin  400 mg Oral QHS  . insulin aspart  0-9 Units Subcutaneous TID WC  . insulin aspart  5 Units Subcutaneous TID WC  . insulin glargine  14 Units Subcutaneous QHS  . levETIRAcetam  500 mg Oral BID  . mouth rinse  15 mL Mouth Rinse BID  . potassium chloride  10 mEq Intravenous Q1 Hr x 4  . QUEtiapine  50 mg Oral BID  . sodium chloride flush  3 mL Intravenous Q12H  . tamsulosin  0.4 mg Oral QHS    Assessment: Pharmacy consulted to manage electrolytes in this DKA patient while patient is on Insulin gtt.   Goal of Therapy:  K >4.0   Plan:  Currently K = 3.5; therefore will supplement. Will give KCl 10 mEq IV x 4 doses. Will recheck K @ 20:00.   Martin Randolph D 11/16/2015,11:43 AM

## 2015-11-16 NOTE — Progress Notes (Signed)
Pt bradycardic in mid/upper 40s upon shift assessment, hypotensive 80s/50s (MAP 50s). Cbg 37. Dextrose 50ml given x1. HR/BP has improved. Will recheck cbg in .

## 2015-11-16 NOTE — Progress Notes (Addendum)
Inpatient Diabetes Program Recommendations  AACE/ADA: New Consensus Statement on Inpatient Glycemic Control (2015)  Target Ranges:  Prepandial:   less than 140 mg/dL      Peak postprandial:   less than 180 mg/dL (1-2 hours)      Critically ill patients:  140 - 180 mg/dL   Lab Results  Component Value Date   GLUCAP 234 (H) 11/16/2015   HGBA1C 8.4 (H) 11/15/2015    Review of Glycemic Control:  Results for Martin Randolph, Martin (MRN [id number]) as of 11/16/2015 14:41  Ref. Range 11/16/2015 07:35 11/16/2015 07:58 11/16/2015 08:45 11/16/2015 11:07 11/16/2015 12:01  Glucose-Capillary Latest Ref Range: 65 - 99 mg/dL 37 (LL) 782123 (H) 956104 (H) 113 (H) 234 (H)    Diabetes history: Diabetes Type 1 Outpatient Diabetes medications: Novolog 5 units tid with meals, Lantus 14 units q HS Current orders for Inpatient glycemic control:  Lantus 14 units q HS, Novolog 5 units tid with meals, Novolog sensitive tid with meals   Inpatient Diabetes Program Recommendations:    Consider reducing Lantus to 12 units q HS. Further may consider reducing Novolog meal coverage to 3 units tid with meals. Text page sent to MD.   Thanks, Beryl MeagerJenny Maicie Vanderloop, RN, BC-ADM Inpatient Diabetes Coordinator Pager [phone] (8a-5p)

## 2015-11-17 LAB — BASIC METABOLIC PANEL
Anion gap: 4 — ABNORMAL LOW (ref 5–15)
BUN: 8 mg/dL (ref 6–20)
CHLORIDE: 109 mmol/L (ref 101–111)
CO2: 28 mmol/L (ref 22–32)
CREATININE: 0.74 mg/dL (ref 0.61–1.24)
Calcium: 8 mg/dL — ABNORMAL LOW (ref 8.9–10.3)
GFR calc Af Amer: >60 mL/min (ref >60)
GFR calc non Af Amer: >60 mL/min (ref >60)
Glucose, Bld: 220 mg/dL — ABNORMAL HIGH (ref 65–99)
Potassium: 3.8 mmol/L (ref 3.5–5.1)
SODIUM: 141 mmol/L (ref 135–145)

## 2015-11-17 LAB — GLUCOSE, CAPILLARY
GLUCOSE-CAPILLARY: 30 mg/dL — AB (ref 65–99)
GLUCOSE-CAPILLARY: 41 mg/dL — AB (ref 65–99)
GLUCOSE-CAPILLARY: 61 mg/dL — AB (ref 65–99)
GLUCOSE-CAPILLARY: 184 mg/dL — AB (ref 65–99)
Glucose-Capillary: 229 mg/dL — ABNORMAL HIGH (ref 65–99)
Glucose-Capillary: 176 mg/dL — ABNORMAL HIGH (ref 65–99)
Glucose-Capillary: 180 mg/dL — ABNORMAL HIGH (ref 65–99)

## 2015-11-17 LAB — PROCALCITONIN: Procalcitonin: <0.1 ng/mL

## 2015-11-17 MED ORDER — DIAZEPAM 2 MG PO TABS
2.0000 mg | ORAL_TABLET | Freq: Every day | ORAL | Status: DC
  Administered 2015-11-17 – 2015-11-19 (×3): 2 mg via ORAL
  Filled 2015-11-17 (×3): qty 1

## 2015-11-17 MED ORDER — QUETIAPINE FUMARATE 25 MG PO TABS
100.0000 mg | ORAL_TABLET | Freq: Two times a day (BID) | ORAL | Status: DC
  Administered 2015-11-17 – 2015-11-20 (×7): 100 mg via ORAL
  Filled 2015-11-17 (×7): qty 4
  Filled 2015-11-17: qty 1

## 2015-11-17 MED ORDER — SODIUM CHLORIDE 0.9% FLUSH
10.0000 mL | Freq: Two times a day (BID) | INTRAVENOUS | Status: DC
  Administered 2015-11-17: 10 mL
  Administered 2015-11-17: 20 mL
  Administered 2015-11-18: 10 mL
  Administered 2015-11-18 – 2015-11-19 (×2): 20 mL
  Administered 2015-11-19 – 2015-11-20 (×2): 10 mL

## 2015-11-17 MED ORDER — SODIUM CHLORIDE 0.9% FLUSH: 10.0000 mL | INTRAVENOUS | Status: DC | PRN

## 2015-11-17 MED ORDER — POTASSIUM CHLORIDE CRYS ER 20 MEQ PO TBCR
20.0000 meq | EXTENDED_RELEASE_TABLET | Freq: Two times a day (BID) | ORAL | Status: AC
  Administered 2015-11-17 (×2): 20 meq via ORAL
  Filled 2015-11-17 (×2): qty 1

## 2015-11-17 NOTE — Progress Notes (Signed)
Patient was off oxygen,  saturation level on RA 100%. Will cont. To monitor.

## 2015-11-17 NOTE — Progress Notes (Signed)
[facility] Physicians - [place] at [facility]   PATIENT NAME: Lam Mccubbins    MRN#:  [id number]  DATE OF BIRTH:  [birth year]  SUBJECTIVE:  Hospital Day: 2 days Dao Memmott is a 50 y.o. male presenting with Hyperglycemia .   Blood sugars improved. Continues to be agitated although better today. Mother at bedside.  Mitts on hands.  Sitter at bedside  REVIEW OF SYSTEMS:  Unable to obtain given mental statu  DRUG ALLERGIES:  No Known Allergies  VITALS:  Blood pressure 120/64, pulse 85, temperature 98.2 F (36.8 C), temperature source Oral, resp. rate 20, height 5\' 3"  (1.6 m), weight 51 kg (112 lb 6.4 oz), SpO2 100 %.  PHYSICAL EXAMINATION:   VITAL SIGNS: Vitals:   11/17/15 1024 11/17/15 1143  BP: 118/60 120/64  Pulse: 100 85  Resp:    Temp:  98.2 F (36.8 C)   GENERAL:50 y.o.male moderate distress given mental status.  HEAD: Normocephalic, atraumatic.  EYES: Pupils equal, round, reactive to light.  NECK:  No JVD.  PULMONARY: Clear to ascultation, without wheeze rails or rhonci.  CARDIOVASCULAR: S1 and S2.  NEUROLOGIC: Moves all 4 extremities  LABORATORY PANEL:   CBC  Recent Labs Lab 11/16/15 0426  WBC 9.5  HGB 13.0  HCT 38.1*  PLT 234   ------------------------------------------------------------------------------------------------------------------  Chemistries   Recent Labs Lab 11/16/15 0426  11/17/15 0520  NA 140  --  141  K 3.5  < > 3.8  CL 111  --  109  CO2 26  --  28  GLUCOSE 60*  --  220*  BUN 12  --  8  CREATININE 0.64  --  0.74  CALCIUM 8.2*  --  8.0*  AST 22  --   --   ALT 23  --   --   ALKPHOS 64  --   --   BILITOT 0.6  --   --   < > = values in this interval not displayed. ------------------------------------------------------------------------------------------------------------------  Cardiac Enzymes  Recent Labs Lab 11/15/15 1011  TROPONINI <0.03    ------------------------------------------------------------------------------------------------------------------  RADIOLOGY:  Dg Chest Port 1 View  Result Date: 11/15/2015 CLINICAL DATA:  Central line placement EXAM: PORTABLE CHEST 1 VIEW COMPARISON:  None. FINDINGS: Bilateral diffuse interstitial thickening. There is no focal parenchymal opacity. There is no pleural effusion or pneumothorax. The heart and mediastinal contours are unremarkable. Left-sided PICC line with the tip projecting over the cavoatrial junction. The osseous structures are unremarkable. IMPRESSION: Left-sided PICC line with the tip projecting over the cavoatrial junction. Bilateral interstitial thickening concerning for interstitial edema. Electronically Signed   By: Elige Ko   On: 11/15/2015 15:04    EKG:  No orders found for this or any previous visit.  ASSESSMENT AND PLAN:   Khambrel Amsden is a 50 y.o. male presenting with Hyperglycemia . Admitted 11/15/2015 : Day #: 2 days   1. DKA has resolved Now on home medications. Continue sliding scale insulin.  2. Bradycardia: Due to hypoglycemia has resolved  3. Agitation Partly baseline and worsened being inpatient in the hospital. Increase dose of Seroquel. Add benzodiazepines at night.  Discussed with mother at bedside in detail. Plan to discontinue sitter when his agitation improves. Discharge back to group home or skilled nursing facility in 1-2 days.  All the records are reviewed and case discussed with Care Management/Social Workerr. Management plans discussed with the patient, family and they are in agreement.  CODE STATUS: dnr TOTAL TIME TAKING CARE OF THIS  PATIENT: 28 minutes.   POSSIBLE D/C IN 1-2DAYS, DEPENDING ON CLINICAL CONDITION.   Milagros LollSudini, Alexxia Stankiewicz R M.D on 11/17/2015 at 12:56 PM  Between 7am to 6pm - Pager - [phone]  After 6pm: House Pager: - [phone]  [facility]  Office  [phone]  CC: Primary care  physician; Pasty Spillersracy N McLean-Scocuzza, MD

## 2015-11-17 NOTE — Progress Notes (Signed)
Patient was noted with low oxygen level, was placed on 2l oxygen via nasal canula. Will monitor

## 2015-11-18 LAB — BASIC METABOLIC PANEL
Anion gap: 9 (ref 5–15)
BUN: 12 mg/dL (ref 6–20)
CHLORIDE: 103 mmol/L (ref 101–111)
CO2: 24 mmol/L (ref 22–32)
CREATININE: 0.81 mg/dL (ref 0.61–1.24)
Calcium: 8.2 mg/dL — ABNORMAL LOW (ref 8.9–10.3)
Glucose, Bld: 509 mg/dL (ref 65–99)
POTASSIUM: 4.8 mmol/L (ref 3.5–5.1)
SODIUM: 136 mmol/L (ref 135–145)

## 2015-11-18 LAB — GLUCOSE, CAPILLARY
GLUCOSE-CAPILLARY: 196 mg/dL — AB (ref 65–99)
GLUCOSE-CAPILLARY: 92 mg/dL (ref 65–99)
Glucose-Capillary: 496 mg/dL — ABNORMAL HIGH (ref 65–99)
Glucose-Capillary: 420 mg/dL — ABNORMAL HIGH (ref 65–99)
Glucose-Capillary: 289 mg/dL — ABNORMAL HIGH (ref 65–99)
Glucose-Capillary: 264 mg/dL — ABNORMAL HIGH (ref 65–99)

## 2015-11-18 LAB — MAGNESIUM: MAGNESIUM: 2.1 mg/dL (ref 1.7–2.4)

## 2015-11-18 LAB — PHOSPHORUS: PHOSPHORUS: 3.6 mg/dL (ref 2.5–4.6)

## 2015-11-18 MED ORDER — INSULIN GLARGINE 100 UNIT/ML ~~LOC~~ SOLN
12.0000 [IU] | SUBCUTANEOUS | Status: AC
  Administered 2015-11-18: 12 [IU] via SUBCUTANEOUS

## 2015-11-18 MED ORDER — INSULIN GLARGINE 100 UNIT/ML ~~LOC~~ SOLN
8.0000 [IU] | Freq: Every day | SUBCUTANEOUS | Status: DC
  Administered 2015-11-18: 8 [IU] via SUBCUTANEOUS
  Filled 2015-11-18 (×2): qty 0.08

## 2015-11-18 MED ORDER — INSULIN ASPART 100 UNIT/ML ~~LOC~~ SOLN
10.0000 [IU] | SUBCUTANEOUS | Status: AC
  Administered 2015-11-18: 10 [IU] via SUBCUTANEOUS

## 2015-11-18 NOTE — Progress Notes (Signed)
BS 509. Notified Dr. Elpidio AnisSudini of BS. New orders received.

## 2015-11-18 NOTE — Care Management Important Message (Signed)
Important Message  Patient Details  Name: Martin Randolph MRN: [id number] Date of Birth: 09-Aug-1965   Medicare Important Message Given:  Yes    Baylynn Shifflett A, RN 11/18/2015, 2:49 PM

## 2015-11-18 NOTE — Progress Notes (Signed)
MEDICATION RELATED CONSULT NOTE - INITIAL   Pharmacy Consult for Electrolyte management  Indication: DKA patient  No Known Allergies  Patient Measurements: Height: 5\' 3"  (160 cm) Weight: 109 lb 1.6 oz (49.5 kg) IBW/kg (Calculated) : 56.9 Adjusted Body Weight:   Vital Signs: Temp: 97.7 F (36.5 C) (10/15 0450) Temp Source: Oral (10/15 0450) BP: 108/69 (10/15 0657) Pulse Rate: 59 (10/15 0657) Intake/Output from previous day: 10/14 0701 - 10/15 0700 In: 3391.7 [P.O.:1800; I.V.:1591.7] Out: 800 [Urine:800] Intake/Output from this shift: No intake/output data recorded.  Labs:  Recent Labs  11/15/15 1011  11/16/15 0426 11/17/15 0520 11/18/15 0550  WBC 6.7  --  9.5  --   --   HGB 13.4  --  13.0  --   --   HCT 40.0  --  38.1*  --   --   PLT 262  --  234  --   --   CREATININE 1.41*  < > 0.64 0.74 0.81  MG  --   --   --   --  2.1  PHOS  --   --   --   --  3.6  ALBUMIN 3.6  --  2.9*  --   --   PROT 6.7  --  5.3*  --   --   AST 28  --  22  --   --   ALT 34  --  23  --   --   ALKPHOS 116  --  64  --   --   BILITOT 1.4*  --  0.6  --   --   < > = values in this interval not displayed. Estimated Creatinine Clearance: 75.5 mL/min (by C-G formula based on SCr of 0.81 mg/dL).   Microbiology: Recent Results (from the past 720 hour(s))  MRSA PCR Screening     Status: None   Collection Time: 10/30/15  4:59 AM  Result Value Ref Range Status   MRSA by PCR NEGATIVE NEGATIVE Final    Comment:        The GeneXpert MRSA Assay (FDA approved for NASAL specimens only), is one component of a comprehensive MRSA colonization surveillance program. It is not intended to diagnose MRSA infection nor to guide or monitor treatment for MRSA infections.   Blood culture (routine x 2)     Status: None (Preliminary result)   Collection Time: 11/15/15 11:51 AM  Result Value Ref Range Status   Specimen Description BLOOD LEFT FOREARM  Final   Special Requests   Final    BOTTLES DRAWN AEROBIC  AND ANAEROBIC AER 9ML ANA 9ML   Culture NO GROWTH 2 DAYS  Final   Report Status PENDING  Incomplete  Blood culture (routine x 2)     Status: None (Preliminary result)   Collection Time: 11/15/15 11:51 AM  Result Value Ref Range Status   Specimen Description BLOOD  RIGHT FOREARM  Final   Special Requests   Final    BOTTLES DRAWN AEROBIC AND ANAEROBIC  AER 8ML ANA 7ML   Culture NO GROWTH 2 DAYS  Final   Report Status PENDING  Incomplete  MRSA PCR Screening     Status: None   Collection Time: 11/15/15  4:19 PM  Result Value Ref Range Status   MRSA by PCR NEGATIVE NEGATIVE Final    Comment:        The GeneXpert MRSA Assay (FDA approved for NASAL specimens only), is one component of a comprehensive MRSA colonization surveillance program. It  is not intended to diagnose MRSA infection nor to guide or monitor treatment for MRSA infections.     Medical History: Past Medical History:  Diagnosis Date  . Alzheimer's dementia   . Diabetes (HCC)   . Down syndrome   . Hyperlipidemia   . Hypothyroidism   . Nonverbal    a. Since age [age] following fall with skull fracture.  . Seizures (HCC)     Medications:  Prescriptions Prior to Admission  Medication Sig Dispense Refill Last Dose  . acetaminophen (TYLENOL) 325 MG tablet Take 650 mg by mouth every 6 (six) hours as needed for mild pain.   prn at prn  . carbamide peroxide (DEBROX) 6.5 % otic solution Place 5-10 drops into both ears 2 (two) times daily.   10/29/2015 at Unknown time  . cetaphil (CETAPHIL) lotion Apply 1 application topically 2 (two) times daily.   10/29/2015 at Unknown time  . dextrose (GLUTOSE) 40 % GEL Take 37.5 g by mouth once as needed for low blood sugar. 1 Tube 2   . donepezil (ARICEPT) 10 MG tablet Take 10 mg by mouth at bedtime.   Past Week at Unknown time  . famotidine (PEPCID) 20 MG tablet Take 20 mg by mouth daily.   10/28/2015 at Unknown time  . feeding supplement, GLUCERNA SHAKE, (GLUCERNA SHAKE) LIQD Take 237  mLs by mouth 3 (three) times daily between meals.   10/29/2015 at Unknown time  . gabapentin (NEURONTIN) 400 MG capsule Take 400 mg by mouth at bedtime.   10/28/2015 at Unknown time  . insulin aspart (NOVOLOG) 100 UNIT/ML injection Inject 5 Units into the skin 3 (three) times daily with meals. 10 mL 11   . insulin glargine (LANTUS) 100 UNIT/ML injection Inject 0.14 mLs (14 Units total) into the skin at bedtime. 10 mL 11   . levETIRAcetam (KEPPRA) 500 MG tablet Take 1 tablet (500 mg total) by mouth 2 (two) times daily. 60 tablet 3   . Melatonin 3 MG TABS Take 3 mg by mouth at bedtime.   10/28/2015 at Unknown time  . metFORMIN (GLUCOPHAGE) 1000 MG tablet Take 1,000 mg by mouth 2 (two) times daily with a meal.   10/28/2015 at Unknown time  . polyethylene glycol (MIRALAX / GLYCOLAX) packet Take 17 g by mouth every 3 (three) days.   Past Month at Unknown time  . QUEtiapine (SEROQUEL) 25 MG tablet Take 25 mg by mouth at bedtime.   Past Week at Unknown time  . QUEtiapine (SEROQUEL) 25 MG tablet Take 25 mg by mouth 2 (two) times daily as needed (agitation).   prn at prn  . QUEtiapine (SEROQUEL) 50 MG tablet Take 50 mg by mouth 2 (two) times daily.   10/29/2015 at Unknown time  . simvastatin (ZOCOR) 10 MG tablet Take 10 mg by mouth at bedtime.   10/28/2015 at Unknown time  . tamsulosin (FLOMAX) 0.4 MG CAPS capsule Take 0.4 mg by mouth at bedtime.   10/28/2015 at Unknown time   Scheduled:  . diazepam  2 mg Oral QHS  . enoxaparin (LOVENOX) injection  40 mg Subcutaneous Q24H  . feeding supplement (GLUCERNA SHAKE)  237 mL Oral TID BM  . gabapentin  400 mg Oral QHS  . insulin aspart  0-9 Units Subcutaneous TID WC  . insulin aspart  3 Units Subcutaneous TID WC  . insulin glargine  12 Units Subcutaneous QHS  . levETIRAcetam  500 mg Oral BID  . mouth rinse  15 mL Mouth Rinse  BID  . QUEtiapine  100 mg Oral BID  . sodium chloride flush  10-40 mL Intracatheter Q12H  . sodium chloride flush  3 mL Intravenous Q12H  .  tamsulosin  0.4 mg Oral QHS    Assessment: Pharmacy consulted to manage electrolytes in this DKA patient while patient is on Insulin gtt.   Goal of Therapy:  K >4.0   Plan:  Currently K = 3.5; therefore will supplement. Will give KCl 10 mEq IV x 4 doses. Will recheck K @ 20:00.   10/13:  K @ 20:21 = 4.4   10/14 0520 K 3.8. Will give potassium chloride 20 mEq PO BID with meals x 2 doses and recheck electrolytes tomorrow with AM labs.  10/15 0550 K 4.8, Mg 2.1, phos 3.6. No further supplementation warranted. Will recheck electrolytes with AM labs.  Carola Frost, Pharm.D., BCPS Clinical Pharmacist 11/18/2015,7:09 AM

## 2015-11-18 NOTE — Progress Notes (Signed)
PT Cancellation Note  Patient Details Name: Martin Randolph MRN: [id number] DOB: [birth year]   Cancelled Treatment:    Reason Eval/Treat Not Completed: Fatigue/lethargy limiting ability to participate;Patient's level of consciousness. Chart reviewed, RN consulted. RN reports that patient has been obtunded all day, and likely will not be able to participate in PT evaluation at this time, with recommendations to defer until tomorrow. Will attempt again at later date/time.    3:28 PM, 11/18/15 Rosamaria LintsAllan C Shye Doty, PT, DPT Physical Therapist - [place] [phone](ASCOM)  [phone] (mobile)

## 2015-11-18 NOTE — Progress Notes (Signed)
[facility] Physicians - [place] at [facility]   PATIENT NAME: Martin Randolph    MRN#:  [id number]  DATE OF BIRTH:  [birth year]  SUBJECTIVE:  Hospital Day: 3 days Martin Randolph is a 50 y.o. male presenting with Hyperglycemia .   Admitted for DKA  Mitts on hands. Sitter at bedside  Lantus not given yesterday night because blood sugars were low. Today blood sugars 509.  REVIEW OF SYSTEMS:  Unable to obtain given mental status.  DRUG ALLERGIES:  No Known Allergies  VITALS:  Blood pressure (!) 99/39, pulse 92, temperature 97.8 F (36.6 C), temperature source Oral, resp. rate 16, height 5\' 3"  (1.6 m), weight 49.5 kg (109 lb 1.6 oz), SpO2 98 %.  PHYSICAL EXAMINATION:   VITAL SIGNS: Vitals:   11/18/15 0657 11/18/15 0900  BP: 108/69 (!) 99/39  Pulse: (!) 59 92  Resp:    Temp:  97.8 F (36.6 C)   GENERAL:50 y.o.male moderate distress given mental status.  HEAD: Normocephalic, atraumatic.  EYES: Pupils equal, round, reactive to light.  NECK:  No JVD.  PULMONARY: Clear to ascultation, without wheeze rails or rhonci.  CARDIOVASCULAR: S1 and S2.  NEUROLOGIC: Moves all 4 extremities Drowsy  LABORATORY PANEL:   CBC  Recent Labs Lab 11/16/15 0426  WBC 9.5  HGB 13.0  HCT 38.1*  PLT 234   ------------------------------------------------------------------------------------------------------------------  Chemistries   Recent Labs Lab 11/16/15 0426  11/18/15 0550  NA 140  < > 136  K 3.5  < > 4.8  CL 111  < > 103  CO2 26  < > 24  GLUCOSE 60*  < > 509*  BUN 12  < > 12  CREATININE 0.64  < > 0.81  CALCIUM 8.2*  < > 8.2*  MG  --   --  2.1  AST 22  --   --   ALT 23  --   --   ALKPHOS 64  --   --   BILITOT 0.6  --   --   < > = values in this interval not displayed. ------------------------------------------------------------------------------------------------------------------  Cardiac Enzymes  Recent Labs Lab 11/15/15 1011  TROPONINI <0.03    ------------------------------------------------------------------------------------------------------------------  RADIOLOGY:  No results found.  EKG:  No orders found for this or any previous visit.  ASSESSMENT AND PLAN:   Martin Randolph is a 50 y.o. male presenting with Hyperglycemia . Admitted 11/15/2015 : Day #: 3 days   1. DKA has resolved Now on home medications. Patient was hypoglycemic yesterday night. This dose was skipped and blood sugars elevated today. We'll resume Lantus at lower dose of 8 units into of 12 units. Sliding scale insulin. Continue pre-meal insulin.  2. Bradycardia: Due to hypoglycemia has resolved  3. Agitation Partly baseline and worsened being inpatient in the hospital. Increased dose of Seroquel. Added benzodiazepines at night.   Plan to discontinue sitter when his agitation improves. Discharge back to group home or skilled nursing facility in 1-2 days.  All the records are reviewed and case discussed with Care Management/Social Workerr. Management plans discussed with the patient, family and they are in agreement.  CODE STATUS: dnr TOTAL TIME TAKING CARE OF THIS PATIENT: 30  minutes.   POSSIBLE D/C IN 1-2DAYS, DEPENDING ON CLINICAL CONDITION.   Milagros Loll R M.D on 11/18/2015 at 10:21 AM  Between 7am to 6pm - Pager - [phone]  After 6pm: House Pager: - [phone]  [facility]  Office  [phone]  CC: Primary care physician; French Ana  Dorris CarnesN McLean-Scocuzza, MD

## 2015-11-19 LAB — BASIC METABOLIC PANEL
Anion gap: 3 — ABNORMAL LOW (ref 5–15)
Anion gap: 6 (ref 5–15)
BUN: 13 mg/dL (ref 6–20)
BUN: 11 mg/dL (ref 6–20)
CALCIUM: 8.6 mg/dL — AB (ref 8.9–10.3)
CALCIUM: 8.8 mg/dL — AB (ref 8.9–10.3)
CO2: 31 mmol/L (ref 22–32)
CO2: 30 mmol/L (ref 22–32)
CREATININE: 0.7 mg/dL (ref 0.61–1.24)
Chloride: 103 mmol/L (ref 101–111)
Chloride: 107 mmol/L (ref 101–111)
Creatinine, Ser: 0.6 mg/dL — ABNORMAL LOW (ref 0.61–1.24)
GFR calc Af Amer: >60 mL/min (ref >60)
GFR calc Af Amer: >60 mL/min (ref >60)
GLUCOSE: 226 mg/dL — AB (ref 65–99)
Glucose, Bld: 71 mg/dL (ref 65–99)
POTASSIUM: 3.8 mmol/L (ref 3.5–5.1)
Potassium: 3.9 mmol/L (ref 3.5–5.1)
SODIUM: 143 mmol/L (ref 135–145)
Sodium: 137 mmol/L (ref 135–145)

## 2015-11-19 LAB — GLUCOSE, CAPILLARY
GLUCOSE-CAPILLARY: 228 mg/dL — AB (ref 65–99)
GLUCOSE-CAPILLARY: 233 mg/dL — AB (ref 65–99)
GLUCOSE-CAPILLARY: 127 mg/dL — AB (ref 65–99)
Glucose-Capillary: 69 mg/dL (ref 65–99)
Glucose-Capillary: 341 mg/dL — ABNORMAL HIGH (ref 65–99)

## 2015-11-19 LAB — MAGNESIUM: Magnesium: 2.1 mg/dL (ref 1.7–2.4)

## 2015-11-19 LAB — PHOSPHORUS: Phosphorus: 4.5 mg/dL (ref 2.5–4.6)

## 2015-11-19 MED ORDER — POLYETHYLENE GLYCOL 3350 17 G PO PACK
17.0000 g | PACK | Freq: Every day | ORAL | Status: DC
  Administered 2015-11-19 – 2015-11-20 (×2): 17 g via ORAL
  Filled 2015-11-19 (×2): qty 1

## 2015-11-19 MED ORDER — SODIUM CHLORIDE 0.9 % IV SOLN
INTRAVENOUS | Status: AC
  Administered 2015-11-19 (×2): via INTRAVENOUS

## 2015-11-19 MED ORDER — INSULIN ASPART 100 UNIT/ML ~~LOC~~ SOLN
0.0000 [IU] | Freq: Every day | SUBCUTANEOUS | Status: DC
  Administered 2015-11-19: 7 [IU] via SUBCUTANEOUS
  Filled 2015-11-19: qty 7

## 2015-11-19 MED ORDER — INSULIN GLARGINE 100 UNIT/ML ~~LOC~~ SOLN
13.0000 [IU] | Freq: Every day | SUBCUTANEOUS | Status: DC
  Administered 2015-11-19: 13 [IU] via SUBCUTANEOUS
  Filled 2015-11-19 (×2): qty 0.13

## 2015-11-19 MED ORDER — ADULT MULTIVITAMIN LIQUID CH
15.0000 mL | Freq: Every day | ORAL | Status: DC
  Administered 2015-11-19 – 2015-11-20 (×2): 15 mL via ORAL
  Filled 2015-11-19 (×2): qty 15

## 2015-11-19 MED ORDER — SODIUM CHLORIDE 0.9 % IV SOLN
INTRAVENOUS | Status: AC
  Administered 2015-11-19: 11:00:00 via INTRAVENOUS

## 2015-11-19 MED ORDER — INSULIN ASPART 100 UNIT/ML ~~LOC~~ SOLN
0.0000 [IU] | Freq: Three times a day (TID) | SUBCUTANEOUS | Status: DC
  Administered 2015-11-20: 9 [IU] via SUBCUTANEOUS
  Filled 2015-11-19: qty 9

## 2015-11-19 NOTE — Progress Notes (Signed)
Inpatient Diabetes Program Recommendations  AACE/ADA: New Consensus Statement on Inpatient Glycemic Control (2015)  Target Ranges:  Prepandial:   less than 140 mg/dL      Peak postprandial:   less than 180 mg/dL (1-2 hours)      Critically ill patients:  140 - 180 mg/dL   Results for TAG, WURTZ (MRN [id number]) as of 11/19/2015 08:11  Ref. Range 11/18/2015 07:29 11/18/2015 08:46 11/18/2015 10:48 11/18/2015 12:34 11/18/2015 16:18 11/18/2015 19:58 11/19/2015 06:22 11/19/2015 07:51  Glucose-Capillary Latest Ref Range: 65 - 99 mg/dL 496 (H)  Novolog 10 units @ 7:39 Lantus 12 units @ 7:38 420 (H)  Novolog 12 units @ 9:00 289 (H) 196 (H)  Novolog 2 units 92 264 (H)  Lantus 8 units @ 20:11 228 (H) 233 (H)   Review of Glycemic Control  Diabetes history: DM1 Outpatient Diabetes medications: Lantus 14 units QHS, Novolog 5 units TID with meals, Metformin 1000 mg BID Current orders for Inpatient glycemic control: Lantus 8 units QHS, Novolog 3 units TID with meals, Novolog 0-9 units TID with meals  Inpatient Diabetes Program Recommendations: Insulin - Basal: Please note that patient has Type 1 diabetes and absolutely requires basal insulin every 24 hours (as well as insulin for meal coverage and correction). Patient received Lantus 12 units at 7:38 am on 11/18/15 and Lantus 8 units at 20:11 on 11/18/15. Please consider increasing Lantus to 13 units QHS.  Correction (SSI): Please order Novolog 0-5 units QHS for bedtime correction. Insulin - Meal Coverage: NURSING: If patient eats at least 50% of meal, please administer Novolog meal coverage as ordered when parameters are met.  NOTE: In reviewing chart, noted fasting glucose 496 mg/dl on 11/18/15 which was likely due to NO basal insulin being given on 11/17/15. Noted on 11/18/15 patient received Novolog 10 units at 7:39 am and Novolog 12 units at 9:00 am. Since patient has Type 1 diabetes he does not make any insulin and needs insulin for basal,  bolus, and correction. However, patient is sensitive to insulin. Do NOT recommend giving Novolog injections any closer than 4 hours due to risk of insulin stacking which could lead to hypoglycemia. Also noted patient did NOT receive any meal coverage for lunch or supper on 11/18/15.   Thanks, Barnie Alderman, RN, MSN, CDE Diabetes Coordinator Inpatient Diabetes Program [phone] (Team Pager from [place] to [place]) [phone] (AP office) [phone] [facility] office) [phone] [facility] office)

## 2015-11-19 NOTE — [place] FL2 (Deleted)
[place] MEDICAID FL2 LEVEL OF CARE SCREENING TOOL     IDENTIFICATION  Patient Name: Martin Randolph Birthdate: December 18, 1965 Sex: male Admission Date (Current Location): 11/15/2015  [place]  [name] [id number] Q Facility and Address:  [facility], [address], [place], [place] [address]      Provider Number: [id number]  Attending Physician Name and Address:  Ramonita LabAruna Preslyn Warr, MD  Relative Name and Phone Number:  [name],[name] Brother [phone]     Current Level of Care: Hospital [facility] Prior [facility]val Number:    Date Approved/Denied:   PASRR Number: [id number]  Discharge Plan: Domiciliary (Rest home) [facility])    Current Diagnoses: Patient Active Problem List   Diagnosis Date Noted  . Protein-calorie malnutrition, severe 11/16/2015  . Hyperglycemia   . Hypotension   . Bradycardia   . Nausea and vomiting   . Diabetic ketoacidosis without coma associated with type 1 diabetes mellitus (HCC) 11/15/2015  . Pressure injury of skin 11/15/2015  . Seizure (HCC)   . Hypoglycemia 10/29/2015  . Diabetes (HCC) 10/29/2015  . Down syndrome 10/29/2015    Orientation RESPIRATION BLADDER Height & Weight     Self, Time  Normal Incontinent Weight: 109 lb 9.6 oz (49.7 kg) Height:  5\' 3"  (160 cm)  BEHAVIORAL SYMPTOMS/MOOD NEUROLOGICAL BOWEL NUTRITION STATUS    Convulsions/Seizures Continent Diet (dysphagia 3)  AMBULATORY STATUS COMMUNICATION OF NEEDS Skin   Limited Assist Non-Verbally PU Stage and Appropriate Care PU Stage 1 Dressing: Daily                     Personal Care Assistance Level of Assistance  Bathing, Dressing Bathing Assistance: Limited assistance   Dressing Assistance: Limited assistance     Functional Limitations Info  Sight, Hearing, Speech Sight Info: Adequate Hearing Info: Adequate Speech Info: Impaired    SPECIAL CARE FACTORS FREQUENCY  PT (By licensed  PT)     PT Frequency: Home Health 2x a week              Contractures Contractures Info: Present    Additional Factors Info  Insulin Sliding Scale Code Status Info: DNR Allergies Info: NKA   Insulin Sliding Scale Info: insulin glargine (LANTUS) injection 13 Units 13 Units, Subcutaneous, Daily at bedtime  ; insulin aspart (novoLOG) injection 0-5 Units 0-5 Units, Subcutaneous, Daily at bedtime  ; insulin aspart (novoLOG) injection 0-9 Units 0-9 Units, Subcutaneous, 3 times daily with meals  ; insulin aspart (novoLOG) injection 3 Units 3 Units, Subcutaneous, 3 times daily with meals         Current Medications (11/19/2015):  This is the current hospital active medication list Current Facility-Administered Medications  Medication Dose Route Frequency Provider Last Rate Last Dose  . 0.9 %  sodium chloride infusion   Intravenous Continuous Ramonita LabAruna Asusena Sigley, MD 100 mL/hr at 11/19/15 1110    . acetaminophen (TYLENOL) tablet 650 mg  650 mg Oral Q6H PRN Milagros LollSrikar Sudini, MD   650 mg at 11/18/15 2336   Or  . acetaminophen (TYLENOL) suppository 650 mg  650 mg Rectal Q6H PRN Srikar Sudini, MD      . albuterol (PROVENTIL) (2.5 MG/3ML) 0.083% nebulizer solution 2.5 mg  2.5 mg Nebulization Q2H PRN Srikar Sudini, MD      . diazepam (VALIUM) tablet 2 mg  2 mg Oral QHS Milagros LollSrikar Sudini, MD   2 mg at 11/18/15 2012  . enoxaparin (LOVENOX) injection 40 mg  40  mg Subcutaneous Q24H Milagros Loll, MD   40 mg at 11/18/15 1556  . feeding supplement (GLUCERNA SHAKE) (GLUCERNA SHAKE) liquid 237 mL  237 mL Oral TID BM Wyatt Haste, MD   237 mL at 11/19/15 1000  . gabapentin (NEURONTIN) capsule 400 mg  400 mg Oral QHS Milagros Loll, MD   400 mg at 11/18/15 2012  . ibuprofen (ADVIL,MOTRIN) tablet 400 mg  400 mg Oral Q6H PRN Srikar Sudini, MD      . insulin aspart (novoLOG) injection 0-5 Units  0-5 Units Subcutaneous QHS Akari Crysler, MD      . insulin aspart (novoLOG) injection 0-9 Units  0-9 Units Subcutaneous TID WC Clois Montavon, MD      . insulin aspart (novoLOG) injection 3 Units  3 Units Subcutaneous TID WC Wyatt Haste, MD   3 Units at 11/19/15 1220  . insulin glargine (LANTUS) injection 13 Units  13 Units Subcutaneous QHS Ramonita Lab, MD      . levETIRAcetam (KEPPRA) tablet 500 mg  500 mg Oral BID Milagros Loll, MD   500 mg at 11/19/15 0944  . LORazepam (ATIVAN) injection 1 mg  1 mg Intravenous Q4H PRN Milagros Loll, MD   1 mg at 11/17/15 2249  . MEDLINE mouth rinse  15 mL Mouth Rinse BID Wyatt Haste, MD   15 mL at 11/18/15 2200  . ondansetron (ZOFRAN) tablet 4 mg  4 mg Oral Q6H PRN Milagros Loll, MD       Or  . ondansetron (ZOFRAN) injection 4 mg  4 mg Intravenous Q6H PRN Srikar Sudini, MD      . polyethylene glycol (MIRALAX / GLYCOLAX) packet 17 g  17 g Oral Daily Luara Faye, MD      . QUEtiapine (SEROQUEL) tablet 100 mg  100 mg Oral BID Milagros Loll, MD   100 mg at 11/19/15 0944  . sodium chloride flush (NS) 0.9 % injection 10-40 mL  10-40 mL Intracatheter Q12H Srikar Sudini, MD   10 mL at 11/19/15 0945  . sodium chloride flush (NS) 0.9 % injection 10-40 mL  10-40 mL Intracatheter PRN Srikar Sudini, MD      . sodium chloride flush (NS) 0.9 % injection 3 mL  3 mL Intravenous Q12H Milagros Loll, MD   3 mL at 11/19/15 0946  . tamsulosin (FLOMAX) capsule 0.4 mg  0.4 mg Oral QHS Srikar Sudini, MD   0.4 mg at 11/18/15 2012     Discharge Medications: Please see discharge summary for a list of discharge medications.  Relevant Imaging Results:  Relevant Lab Results:   Additional Information SSN [id number]  Darleene Cleaver

## 2015-11-19 NOTE — Discharge Summary (Addendum)
[facility] - [place] at [facility]   PATIENT NAME: Martin Randolph    MR#:  [id number]  DATE OF BIRTH:  29-Oct-1965  DATE OF ADMISSION:  11/15/2015 ADMITTING PHYSICIAN: Milagros LollSrikar Sudini, MD  DATE OF DISCHARGE: 11/20/2015 PRIMARY CARE PHYSICIAN: Pasty Spillersracy N McLean-Scocuzza, MD    ADMISSION DIAGNOSIS:  Cough [R05] Lactic acidosis [E87.2] Hyperglycemia [R73.9] S/P PICC central line placement [Z95.828] Hypotension, unspecified hypotension type [I95.9] Diabetic ketoacidosis without coma associated with type 1 diabetes mellitus (HCC) [E10.10] Nausea and vomiting, intractability of vomiting not specified, unspecified vomiting type [R11.2]  DISCHARGE DIAGNOSIS:  Active Problems:   Diabetic ketoacidosis without coma associated with type 1 diabetes mellitus (HCC)   Pressure injury of skin   Protein-calorie malnutrition, severe   Hyperglycemia   Hypotension   Bradycardia   Nausea and vomiting   SECONDARY DIAGNOSIS:   Past Medical History:  Diagnosis Date  . Alzheimer's dementia   . Diabetes (HCC)   . Down syndrome   . Hyperlipidemia   . Hypothyroidism   . Nonverbal    a. Since age [age] following fall with skull fracture.  . Seizures [facility](HCC)     HOSPITAL COURSE:  Martin Partridgevan Obst is a 50 y.o. male presenting with Hyperglycemia please review history and physical for details . Admitted 11/15/2015 :   1. DKA has resolved Patient is brittle diabetic needs to be seen by endocrinologist as an outpatient discussed with family members they're agreeable .Continue  Lantus to 13 units daily at bedtime. Continue NovoLog 3 units 3 times a day with meals and sensitive (0-9 units Sliding scale insulin. And NovoLog 0-5 units daily at bedtime   2. Bradycardia: Due to hypoglycemia has resolved  3. Agitation- Patient has chronic history of Down syndrome,patient will be benefited at nursing home rather than group home. He is at his baseline according to the family members mom  and brother Provided IV fluids   Increased dose of Seroquel. Added benzodiazepines at night.   DISCHARGE CONDITIONS:   fair  CONSULTS OBTAINED:     PROCEDURES none  DRUG ALLERGIES:  No Known Allergies  DISCHARGE MEDICATIONS:   Current Discharge Medication List    START taking these medications   Details  albuterol (PROVENTIL) (2.5 MG/3ML) 0.083% nebulizer solution Take 3 mLs (2.5 mg total) by nebulization every 6 (six) hours as needed for wheezing. Qty: 75 mL, Refills: 12    diazepam (VALIUM) 2 MG tablet Take 1 tablet (2 mg total) by mouth at bedtime. Qty: 30 tablet, Refills: 0    Multiple Vitamin (MULTIVITAMIN) LIQD Take 15 mLs by mouth daily. Qty: 473 mL, Refills: 0      CONTINUE these medications which have CHANGED   Details  insulin glargine (LANTUS) 100 UNIT/ML injection Inject 0.13 mLs (13 Units total) into the skin at bedtime. Qty: 10 mL, Refills: 11    !! QUEtiapine (SEROQUEL) 50 MG tablet Take 2 tablets (100 mg total) by mouth 2 (two) times daily. Qty: 60 tablet, Refills: 0     !! - Potential duplicate medications found. Please discuss with provider.    CONTINUE these medications which have NOT CHANGED   Details  acetaminophen (TYLENOL) 325 MG tablet Take 650 mg by mouth every 6 (six) hours as needed for mild pain.    carbamide peroxide (DEBROX) 6.5 % otic solution Place 5-10 drops into both ears 2 (two) times daily.    cetaphil (CETAPHIL) lotion Apply 1 application topically 2 (two) times daily.    dextrose (GLUTOSE) 40 %  GEL Take 37.5 g by mouth once as needed for low blood sugar. Qty: 1 Tube, Refills: 2    donepezil (ARICEPT) 10 MG tablet Take 10 mg by mouth at bedtime.    famotidine (PEPCID) 20 MG tablet Take 20 mg by mouth daily.    feeding supplement, GLUCERNA SHAKE, (GLUCERNA SHAKE) LIQD Take 237 mLs by mouth 3 (three) times daily between meals.    gabapentin (NEURONTIN) 400 MG capsule Take 400 mg by mouth at bedtime.    insulin aspart  (NOVOLOG) 100 UNIT/ML injection Inject 5 Units into the skin 3 (three) times daily with meals. Qty: 10 mL, Refills: 11    levETIRAcetam (KEPPRA) 500 MG tablet Take 1 tablet (500 mg total) by mouth 2 (two) times daily. Qty: 60 tablet, Refills: 3    Melatonin 3 MG TABS Take 3 mg by mouth at bedtime.    polyethylene glycol (MIRALAX / GLYCOLAX) packet Take 17 g by mouth every 3 (three) days.    !! QUEtiapine (SEROQUEL) 25 MG tablet Take 25 mg by mouth at bedtime.    simvastatin (ZOCOR) 10 MG tablet Take 10 mg by mouth at bedtime.    tamsulosin (FLOMAX) 0.4 MG CAPS capsule Take 0.4 mg by mouth at bedtime.     !! - Potential duplicate medications found. Please discuss with provider.    STOP taking these medications     metFORMIN (GLUCOPHAGE) 1000 MG tablet          DISCHARGE INSTRUCTIONS:   Activity as tolerated Diet diabetic with Glucerna shakes 3 times a day Follow-up with primary care physician in 3-5 days Follow-up with endocrinology Dr. Tedd Sias in a week  DIET:  Diabetic diet  DISCHARGE CONDITION:  Fair  ACTIVITY:  Activity as tolerated  OXYGEN:  Home Oxygen: No.   Oxygen Delivery: room air  DISCHARGE LOCATION:  group home   If you experience worsening of your admission symptoms, develop shortness of breath, life threatening emergency, suicidal or homicidal thoughts you must seek medical attention immediately by calling 911 or calling your MD immediately  if symptoms less severe.  You Must read complete instructions/literature along with all the possible adverse reactions/side effects for all the Medicines you take and that have been prescribed to you. Take any new Medicines after you have completely understood and accpet all the possible adverse reactions/side effects.   Please note  You were cared for by a hospitalist during your hospital stay. If you have any questions about your discharge medications or the care you received while you were in the hospital  after you are discharged, you can call the unit and asked to speak with the hospitalist on call if the hospitalist that took care of you is not available. Once you are discharged, your primary care physician will handle any further medical issues. Please note that NO REFILLS for any discharge medications will be authorized once you are discharged, as it is imperative that you return to your primary care physician (or establish a relationship with a primary care physician if you do not have one) for your aftercare needs so that they can reassess your need for medications and monitor your lab values.     Today  Chief Complaint  Patient presents with  . Hyperglycemia   Pt is at his baselineAccording to family members. No overnight events  ROS: unobtainable   VITAL SIGNS:  Blood pressure (!) 142/120, pulse 97, temperature 98 F (36.7 C), resp. rate 18, height 5\' 3"  (1.6 m), weight  49.9 kg (110 lb), SpO2 96 %.  I/O:    Intake/Output Summary (Last 24 hours) at 11/20/15 1341 Last data filed at 11/20/15 1009  Gross per 24 hour  Intake           886.25 ml  Output             2200 ml  Net         -1313.75 ml    PHYSICAL EXAMINATION:  GENERAL:  50 y.o.-year-old patient lying in the bed with no acute distress.  EYES: Pupils equal, round, reactive to light and accommodation. No scleral icterus.  HEENT: Head atraumatic. Oropharynx and nasopharynx clear.  NECK:  Supple, no jugular venous distention. No thyroid enlargement, no tenderness.  LUNGS: Normal breath sounds bilaterally, no wheezing, rales,rhonchi or crepitation. No use of accessory muscles of respiration.  CARDIOVASCULAR: S1, S2 normal. No murmurs, rubs, or gallops.  ABDOMEN: Soft, non-tender, non-distended. Bowel sounds present. No organomegaly or mass.  EXTREMITIES: No pedal edema, cyanosis, or clubbing.  NEUROLOGIC: Spontaneously moving all extremities PSYCHIATRIC: The patient is alert and awake SKIN: No obvious rash, lesion, or  ulcer.   DATA REVIEW:   CBC  Recent Labs Lab 11/20/15 0447  WBC 4.6  HGB 12.0*  HCT 35.1*  PLT 173    Chemistries   Recent Labs Lab 11/16/15 0426  11/20/15 0447  NA 140  < > 139  K 3.5  < > 3.7  CL 111  < > 106  CO2 26  < > 27  GLUCOSE 60*  < > 65  BUN 12  < > 15  CREATININE 0.64  < > 0.60*  CALCIUM 8.2*  < > 8.6*  MG  --   < > 2.1  AST 22  --   --   ALT 23  --   --   ALKPHOS 64  --   --   BILITOT 0.6  --   --   < > = values in this interval not displayed.  Cardiac Enzymes  Recent Labs Lab 11/15/15 1011  TROPONINI <0.03    Microbiology Results  Results for orders placed or performed during the hospital encounter of 11/15/15  Blood culture (routine x 2)     Status: None   Collection Time: 11/15/15 11:51 AM  Result Value Ref Range Status   Specimen Description BLOOD LEFT FOREARM  Final   Special Requests   Final    BOTTLES DRAWN AEROBIC AND ANAEROBIC AER ANA   Culture NO GROWTH 5 DAYS  Final   Report Status 11/20/2015 FINAL  Final  Blood culture (routine x 2)     Status: None   Collection Time: 11/15/15 11:51 AM  Result Value Ref Range Status   Specimen Description BLOOD  RIGHT FOREARM  Final   Special Requests   Final    BOTTLES DRAWN AEROBIC AND ANAEROBIC  AER ANA   Culture NO GROWTH 5 DAYS  Final   Report Status 11/20/2015 FINAL  Final  MRSA PCR Screening     Status: None   Collection Time: 11/15/15  4:19 PM  Result Value Ref Range Status   MRSA by PCR NEGATIVE NEGATIVE Final    Comment:        The GeneXpert MRSA Assay (FDA approved for NASAL specimens only), is one component of a comprehensive MRSA colonization surveillance program. It is not intended to diagnose MRSA infection nor to guide or monitor treatment for MRSA infections.  RADIOLOGY:  No results found.  EKG:  No orders found for this or any previous visit.    Management plans discussed with the  family and they are in agreement.  CODE STATUS:      Code Status Orders        Start     Ordered   11/15/15 1355  Do not attempt resuscitation (DNR)  Continuous    Question Answer Comment  In the event of cardiac or respiratory ARREST Do not call a "code blue"   In the event of cardiac or respiratory ARREST Do not perform Intubation, CPR, defibrillation or ACLS   In the event of cardiac or respiratory ARREST Use medication by any route, position, wound care, and other measures to relive pain and suffering. May use oxygen, suction and manual treatment of airway obstruction as needed for comfort.      11/15/15 1354    Code Status History    Date Active Date Inactive Code Status Order ID Comments User Context   11/15/2015  1:09 PM 11/15/2015  1:54 PM Full Code [id number]  Milagros Loll, MD ED   10/29/2015 11:07 PM 11/03/2015  5:04 PM Full Code [id number]  Oralia Manis, MD ED      TOTAL TIME TAKING CARE OF THIS PATIENT: 45  minutes.   Note: This dictation was prepared with Dragon dictation along with smaller phrase technology. Any transcriptional errors that result from this process are unintentional.   @MEC @  on 11/20/2015 at 1:41 PM  Between 7am to 6pm - Pager - [phone]  After 6pm go to [email or web address] - password [email or web address] [facility]  [facility]  Office  [phone]  CC: Primary care physician; Pasty Spillers McLean-Scocuzza, MD

## 2015-11-19 NOTE — Evaluation (Signed)
Physical Therapy Evaluation Patient Details Name: Martin Randolph MRN: [id number] DOB: 04/22/1965 Today's Date: 11/19/2015   History of Present Illness  pt. is 50y.o. male brought to [facility] from group home, admitted to [facility] with diabetic ketoacidosis. PMH; down syndrome, TBI, DM.   Clinical Impression  Pt. Supine in bed upon arrival. Pt. Non-verbal at baseline, hx. Of baseline mobility obtained from PT at group home where pt. Resides. PT states that pt's ability to participate in PT is inconsistent and infrequent however, pt. Does demonstrate ability to ambulate approx. 0.25 mile min-mod A on "good days" with familiar caregivers but is resistant to use of AD as well as any type of physical activity on "bad days" Unable to perform formal assessment due to pt. Limited ability to participate; demonstrates grossly WFL  B UE AROM and strength, demonstrates at least 3/5 strength B LE Able to perform bed mobility mod A, able to sit EOB and maintain sitting balance with supervision A. Unable/unsafe to progress mobility further during today's session due to pt. Agitation and inability to follow commands. Will re-attempt to further evaluate patient and progress mobility at later time/date in hopes that he will demonstrate decreased agitation/participation possibly with presence of family member. Would benefit from skilled PT to address above deficits and promote optimal return to PLOF Recommend continuation of PT services at group home where pt. Was previously residing in addition to 24/7 supervision.     Follow Up Recommendations Home health PT    Equipment Recommendations       Recommendations for Other Services       Precautions / Restrictions Precautions Precautions: Fall Restrictions Weight Bearing Restrictions: No      Mobility  Bed Mobility Overal bed mobility: + 2 for safety/equipment;Needs Assistance Bed Mobility: Supine to Sit;Sit to Supine     Supine to sit: Modified independent  (Device/Increase time) Sit to supine: +2 for safety/equipment   General bed mobility comments: Pt. able to assist with UE and LE movements, requires mod A for UE assist to pull trunk to sitting EOB. Pt. is unpredictable and requires +2 for safety  Transfers Overall transfer level:  (unsafe to perform at this time)                  Ambulation/Gait Ambulation/Gait assistance:  (unsafe to perform at this time)              Stairs            Wheelchair Mobility    Modified Rankin (Stroke Patients Only)       Balance Overall balance assessment: Needs assistance Sitting-balance support: Bilateral upper extremity supported;Feet supported Sitting balance-Leahy Scale: Good Sitting balance - Comments: Pt. able to sit EOB with BUE BLE supported and maintain balance without additional assist    Standing balance support:  (unsafe to attempt standing activity today)                                 Pertinent Vitals/Pain Pain Assessment: No/denies pain    Home Living Family/patient expects to be discharged to:: Assisted living               Home Equipment: None      Prior Function Level of Independence: Needs assistance         Comments: Per PT at group home pt. is resistive to use of AD at baseline, on his good days is able to  ambulate approx. 1/4 mile with min-mod A, on his bad days he is resistant to physical activity. Overall pt. demonstrates inconsistent and infrequent abiltiy to participate in PT at baseline.  his group home provides him 1:1 assist during waking hours.      Hand Dominance        Extremity/Trunk Assessment   Upper Extremity Assessment: Overall WFL for tasks assessed (Pt. demonstrates grossly WNL B UE AROM, strength grossly 4/5, unable to perform formal assessment due to pt. inability to participate)           Lower Extremity Assessment:  (Unable to perform formal assessment due to pt. inabiltiy to follow  commands/participate, demonstrates ability to assist with BLE towards EOB grossly at least3/5)         Communication   Communication:  (Pt. is nonverbal at baseline. )  Cognition Arousal/Alertness: Awake/alert Behavior During Therapy: Restless;Agitated;Impulsive Overall Cognitive Status: History of cognitive impairments - at baseline                      General Comments      Exercises     Assessment/Plan    PT Assessment Patient needs continued PT services  PT Problem List Decreased mobility;Decreased activity tolerance;Decreased knowledge of use of DME          PT Treatment Interventions DME instruction;Functional mobility training;Therapeutic activities;Gait training;Stair training;Therapeutic exercise;Neuromuscular re-education;Balance training;Patient/family education    PT Goals (Current goals can be found in the Care Plan section)  Acute Rehab PT Goals PT Goal Formulation: Patient unable to participate in goal setting    Frequency Min 2X/week   Barriers to discharge        Co-evaluation               End of Session Equipment Utilized During Treatment: Gait belt Activity Tolerance: Treatment limited secondary to agitation Patient left: in bed;with call bell/phone within reach;with bed alarm set;with restraints reapplied           Time: [id number] PT Time Calculation (min) (ACUTE ONLY): 15 min   Charges:         PT G Codes:        Ayshia Gramlich, SPT  11/19/15,11:54 AM

## 2015-11-19 NOTE — Clinical Social Work Note (Signed)
MSW spoke with patient's brother and mother, patient's family feels he needs to go to a SNF for long term care.  Patient has been at a group home, but the family feels his medical needs have increased significantly and the group home is not able to meet his needs anymore.  MSW explained to patient's family that due to his special medical needs, it may be difficult to try to find placement for him.  Patient's family is open to wherever MSW can find a bed.  MSW was given permission by patient's family to begin bed search for a long term care placement facility.  MSW to continue to follow patient's progress throughout discharge planning.  Martin KnackEric R. Hassan Randolph, MSW [phone]  Mon-Fri 8a-4:30p 11/19/2015 4:56 PM

## 2015-11-19 NOTE — Progress Notes (Signed)
[facility] - [place] at [facility]   PATIENT NAME: Martin Randolph    MRN#:  [id number]  DATE OF BIRTH:  April 12, 1965  SUBJECTIVE:  Hospital Day: 4 days Malik Paar is a 50 y.o. male presenting with Hyperglycemia .   Admitted for DKA. As per nurse's report intermittent episodes of agitation. Patient could not provide history as he is mentally challenged  Mitts on hands. Sitter at bedside d/ced     REVIEW OF SYSTEMS:  Unable to obtain given mental status.  DRUG ALLERGIES:  No Known Allergies  VITALS:  Blood pressure 116/74, pulse 64, temperature 98.2 F (36.8 C), temperature source Oral, resp. rate 18, height 5\' 3"  (1.6 m), weight 49.7 kg (109 lb 9.6 oz), SpO2 99 %.  PHYSICAL EXAMINATION:   VITAL SIGNS: Vitals:   11/19/15 0455 11/19/15 0820  BP: (!) 119/58 116/74  Pulse: (!) 56 64  Resp: (!) 22 18  Temp: 97.8 F (36.6 C) 98.2 F (36.8 C)   GENERAL:50 y.o.male moderate distress given mental status.  HEAD: Down's facies, atraumatic.  EYES: Pupils equal, round, reactive to light.  NECK:  No JVD.  PULMONARY: Clear to ascultation, without wheeze rails or rhonci.  CARDIOVASCULAR: S1 and S2.  NEUROLOGIC: Moves all 4 extremitiesSpontaneously   LABORATORY PANEL:   CBC  Recent Labs Randolph 11/16/15 0426  WBC 9.5  HGB 13.0  HCT 38.1*  PLT 234   ------------------------------------------------------------------------------------------------------------------  Chemistries   Recent Labs Randolph 11/16/15 0426  11/19/15 0548  NA 140  < > 137  K 3.5  < > 3.9  CL 111  < > 103  CO2 26  < > 31  GLUCOSE 60*  < > 226*  BUN 12  < > 13  CREATININE 0.64  < > 0.70  CALCIUM 8.2*  < > 8.6*  MG  --   < > 2.1  AST 22  --   --   ALT 23  --   --   ALKPHOS 64  --   --   BILITOT 0.6  --   --   < > = values in this interval not  displayed. ------------------------------------------------------------------------------------------------------------------  Cardiac Enzymes  Recent Labs Randolph 11/15/15 1011  TROPONINI <0.03   ------------------------------------------------------------------------------------------------------------------  RADIOLOGY:  No new studies  EKG:  No orders found for this or any previous visit.  ASSESSMENT AND PLAN:   Martin Randolph is a 50 y.o. male presenting with Hyperglycemia . Admitted 11/15/2015 : Day #: 4 days   1. DKA has resolved Now on home medications. Martin Randolph Increase  Lantus to 13 units daily at bedtime. Sliding scale insulin. Continue pre-meal insulin.  2. Bradycardia: Due to hypoglycemia has resolved  3. Agitation Provide IV fluids and check repeat labs in a.m. Partly baseline and worsened being inpatient in the hospital. Increased dose of Seroquel. Added benzodiazepines at night.  Repeat a.m. Labs Chief Strategy Officer. Continue close monitoring     Discharge back to group home or skilled nursing facility in a.m.  All the records are reviewed and case discussed with Care Management/Social Workerr. Management plans discussed with the patient, family and they are in agreement.  CODE STATUS: dnr TOTAL TIME TAKING CARE OF THIS PATIENT: 33  minutes.   POSSIBLE D/C IN 1-2DAYS, DEPENDING ON CLINICAL CONDITION.   Martin Randolph M.D on 11/19/2015 at 2:51 PM  Between 7am to 6pm - Pager - [phone]  After 6pm: House Pager: - [phone]  [facility]  Office  [phone]  CC:  Primary care physician; Pasty Spillersracy N McLean-Scocuzza, MD

## 2015-11-19 NOTE — Progress Notes (Signed)
MEDICATION RELATED CONSULT NOTE - INITIAL   Pharmacy Consult for Electrolyte management  Indication: DKA patient  No Known Allergies  Patient Measurements: Height: 5\' 3"  (160 cm) Weight: 109 lb 9.6 oz (49.7 kg) IBW/kg (Calculated) : 56.9 Adjusted Body Weight:   Vital Signs: Temp: 97.8 F (36.6 C) (10/16 0455) Temp Source: Oral (10/16 0455) BP: 119/58 (10/16 0455) Pulse Rate: 56 (10/16 0455) Intake/Output from previous day: 10/15 0701 - 10/16 0700 In: 360 [P.O.:360] Out: 900 [Urine:900] Intake/Output from this shift: Total I/O In: 240 [P.O.:240] Out: -   Labs:  Recent Labs  11/17/15 0520 11/18/15 0550 11/19/15 0548  CREATININE 0.74 0.81 0.70  MG  --  2.1 2.1  PHOS  --  3.6 4.5   Estimated Creatinine Clearance: 76.8 mL/min (by C-G formula based on SCr of 0.7 mg/dL).   Microbiology: Recent Results (from the past 720 hour(s))  MRSA PCR Screening     Status: None   Collection Time: 10/30/15  4:59 AM  Result Value Ref Range Status   MRSA by PCR NEGATIVE NEGATIVE Final    Comment:        The GeneXpert MRSA Assay (FDA approved for NASAL specimens only), is one component of a comprehensive MRSA colonization surveillance program. It is not intended to diagnose MRSA infection nor to guide or monitor treatment for MRSA infections.   Blood culture (routine x 2)     Status: None (Preliminary result)   Collection Time: 11/15/15 11:51 AM  Result Value Ref Range Status   Specimen Description BLOOD LEFT FOREARM  Final   Special Requests   Final    BOTTLES DRAWN AEROBIC AND ANAEROBIC AER ANA   Culture NO GROWTH 3 DAYS  Final   Report Status PENDING  Incomplete  Blood culture (routine x 2)     Status: None (Preliminary result)   Collection Time: 11/15/15 11:51 AM  Result Value Ref Range Status   Specimen Description BLOOD  RIGHT FOREARM  Final   Special Requests   Final    BOTTLES DRAWN AEROBIC AND ANAEROBIC  AER ANA   Culture NO GROWTH 3 DAYS   Final   Report Status PENDING  Incomplete  MRSA PCR Screening     Status: None   Collection Time: 11/15/15  4:19 PM  Result Value Ref Range Status   MRSA by PCR NEGATIVE NEGATIVE Final    Comment:        The GeneXpert MRSA Assay (FDA approved for NASAL specimens only), is one component of a comprehensive MRSA colonization surveillance program. It is not intended to diagnose MRSA infection nor to guide or monitor treatment for MRSA infections.     Medical History: Past Medical History:  Diagnosis Date  . Alzheimer's dementia   . Diabetes (HCC)   . Down syndrome   . Hyperlipidemia   . Hypothyroidism   . Nonverbal    a. Since age [age] following fall with skull fracture.  . Seizures (HCC)     Medications:  Prescriptions Prior to Admission  Medication Sig Dispense Refill Last Dose  . acetaminophen (TYLENOL) 325 MG tablet Take 650 mg by mouth every 6 (six) hours as needed for mild pain.   prn at prn  . carbamide peroxide (DEBROX) 6.5 % otic solution Place 5-10 drops into both ears 2 (two) times daily.   10/29/2015 at Unknown time  . cetaphil (CETAPHIL) lotion Apply 1 application topically 2 (two) times daily.   10/29/2015 at Unknown time  .  dextrose (GLUTOSE) 40 % GEL Take 37.5 g by mouth once as needed for low blood sugar. 1 Tube 2   . donepezil (ARICEPT) 10 MG tablet Take 10 mg by mouth at bedtime.   Past Week at Unknown time  . famotidine (PEPCID) 20 MG tablet Take 20 mg by mouth daily.   10/28/2015 at Unknown time  . feeding supplement, GLUCERNA SHAKE, (GLUCERNA SHAKE) LIQD Take 237 mLs by mouth 3 (three) times daily between meals.   10/29/2015 at Unknown time  . gabapentin (NEURONTIN) 400 MG capsule Take 400 mg by mouth at bedtime.   10/28/2015 at Unknown time  . insulin aspart (NOVOLOG) 100 UNIT/ML injection Inject 5 Units into the skin 3 (three) times daily with meals. 10 mL 11   . insulin glargine (LANTUS) 100 UNIT/ML injection Inject 0.14 mLs (14 Units total) into the skin  at bedtime. 10 mL 11   . levETIRAcetam (KEPPRA) 500 MG tablet Take 1 tablet (500 mg total) by mouth 2 (two) times daily. 60 tablet 3   . Melatonin 3 MG TABS Take 3 mg by mouth at bedtime.   10/28/2015 at Unknown time  . metFORMIN (GLUCOPHAGE) 1000 MG tablet Take 1,000 mg by mouth 2 (two) times daily with a meal.   10/28/2015 at Unknown time  . polyethylene glycol (MIRALAX / GLYCOLAX) packet Take 17 g by mouth every 3 (three) days.   Past Month at Unknown time  . QUEtiapine (SEROQUEL) 25 MG tablet Take 25 mg by mouth at bedtime.   Past Week at Unknown time  . QUEtiapine (SEROQUEL) 25 MG tablet Take 25 mg by mouth 2 (two) times daily as needed (agitation).   prn at prn  . QUEtiapine (SEROQUEL) 50 MG tablet Take 50 mg by mouth 2 (two) times daily.   10/29/2015 at Unknown time  . simvastatin (ZOCOR) 10 MG tablet Take 10 mg by mouth at bedtime.   10/28/2015 at Unknown time  . tamsulosin (FLOMAX) 0.4 MG CAPS capsule Take 0.4 mg by mouth at bedtime.   10/28/2015 at Unknown time   Scheduled:  . diazepam  2 mg Oral QHS  . enoxaparin (LOVENOX) injection  40 mg Subcutaneous Q24H  . feeding supplement (GLUCERNA SHAKE)  237 mL Oral TID BM  . gabapentin  400 mg Oral QHS  . insulin aspart  0-9 Units Subcutaneous TID WC  . insulin aspart  3 Units Subcutaneous TID WC  . insulin glargine  8 Units Subcutaneous QHS  . levETIRAcetam  500 mg Oral BID  . mouth rinse  15 mL Mouth Rinse BID  . QUEtiapine  100 mg Oral BID  . sodium chloride flush  10-40 mL Intracatheter Q12H  . sodium chloride flush  3 mL Intravenous Q12H  . tamsulosin  0.4 mg Oral QHS    Assessment: Pharmacy consulted to manage electrolytes in this DKA patient while patient is on Insulin gtt.   Goal of Therapy:  K >4.0   Plan:  Currently K = 3.5; therefore will supplement. Will give KCl 10 mEq IV x 4 doses. Will recheck K @ 20:00.   10/13:  K @ 20:21 = 4.4   10/14 0520 K 3.8. Will give potassium chloride 20 mEq PO BID with meals x 2 doses  and recheck electrolytes tomorrow with AM labs.  10/15 0550 K 4.8, Mg 2.1, phos 3.6. No further supplementation warranted. Will recheck electrolytes with AM labs.  10/16 0622 K 3.9, Mg 2.1, phos 4.5. No further supplementation warranted. Will recheck  electrolytes with AM labs.  Carola FrostNathan A Neesha Langton, Pharm.D., BCPS Clinical Pharmacist 11/19/2015,6:44 AM

## 2015-11-19 NOTE — NC FL2 (Deleted)
[place] MEDICAID FL2 LEVEL OF CARE SCREENING TOOL     IDENTIFICATION  Patient Name: Martin Randolph Birthdate: 07-Aug-1965 Sex: male Admission Date (Current Location): 11/15/2015  [place]  [name] [id number] Q Facility and Address:  [facility], [address], [place], [place] [address]      Provider Number: [id number]  Attending Physician Name and Address:  Ramonita LabAruna Gouru, MD  Relative Name and Phone Number:       Current Level of Care: Hospital [facility] Prior Approval Number:    Date Approved/Denied:   PASRR Number: [id number]  Discharge Plan: Other (Comment)    Current Diagnoses: Patient Active Problem List   Diagnosis Date Noted  . Protein-calorie malnutrition, severe 11/16/2015  . Hyperglycemia   . Hypotension   . Bradycardia   . Nausea and vomiting   . Diabetic ketoacidosis without coma associated with type 1 diabetes mellitus (HCC) 11/15/2015  . Pressure injury of skin 11/15/2015  . Seizure (HCC)   . Hypoglycemia 10/29/2015  . Diabetes (HCC) 10/29/2015  . Down syndrome 10/29/2015    Orientation RESPIRATION BLADDER Height & Weight     Self, Time  Normal Incontinent Weight: 109 lb 9.6 oz (49.7 kg) Height:  5\' 3"  (160 cm)  BEHAVIORAL SYMPTOMS/MOOD NEUROLOGICAL BOWEL NUTRITION STATUS      Continent Diet (dysphagia 3)  AMBULATORY STATUS COMMUNICATION OF NEEDS Skin   Limited Assist Non-Verbally PU Stage and Appropriate Care PU Stage 1 Dressing: Daily                     Personal Care Assistance Level of Assistance  Bathing, Dressing Bathing Assistance: Limited assistance   Dressing Assistance: Limited assistance     Functional Limitations Info  Sight, Hearing, Speech Sight Info: Adequate Hearing Info: Adequate Speech Info: Impaired    SPECIAL CARE FACTORS FREQUENCY                       Contractures Contractures Info: Present    Additional Factors  Info  Allergies, Code Status, Insulin Sliding Scale Code Status Info: DNR Allergies Info: NKA   Insulin Sliding Scale Info: 3x day with meals       Current Medications (11/19/2015):  This is the current hospital active medication list Current Facility-Administered Medications  Medication Dose Route Frequency Provider Last Rate Last Dose  . 0.9 %  sodium chloride infusion   Intravenous Continuous Ramonita LabAruna Gouru, MD 100 mL/hr at 11/19/15 1110    . acetaminophen (TYLENOL) tablet 650 mg  650 mg Oral Q6H PRN Milagros LollSrikar Sudini, MD   650 mg at 11/18/15 2336   Or  . acetaminophen (TYLENOL) suppository 650 mg  650 mg Rectal Q6H PRN Srikar Sudini, MD      . albuterol (PROVENTIL) (2.5 MG/3ML) 0.083% nebulizer solution 2.5 mg  2.5 mg Nebulization Q2H PRN Srikar Sudini, MD      . diazepam (VALIUM) tablet 2 mg  2 mg Oral QHS Milagros LollSrikar Sudini, MD   2 mg at 11/18/15 2012  . enoxaparin (LOVENOX) injection 40 mg  40 mg Subcutaneous Q24H Milagros LollSrikar Sudini, MD   40 mg at 11/18/15 1556  . feeding supplement (GLUCERNA SHAKE) (GLUCERNA SHAKE) liquid 237 mL  237 mL Oral TID BM Wyatt Hasteavid K Hower, MD   237 mL at 11/19/15 1000  . gabapentin (NEURONTIN) capsule 400 mg  400 mg Oral QHS Milagros LollSrikar Sudini, MD   400 mg at 11/18/15 2012  .  ibuprofen (ADVIL,MOTRIN) tablet 400 mg  400 mg Oral Q6H PRN Srikar Sudini, MD      . insulin aspart (novoLOG) injection 0-5 Units  0-5 Units Subcutaneous QHS Aruna Gouru, MD      . insulin aspart (novoLOG) injection 0-9 Units  0-9 Units Subcutaneous TID WC Aruna Gouru, MD      . insulin aspart (novoLOG) injection 3 Units  3 Units Subcutaneous TID WC Wyatt Haste, MD   3 Units at 11/19/15 1220  . insulin glargine (LANTUS) injection 13 Units  13 Units Subcutaneous QHS Ramonita Lab, MD      . levETIRAcetam (KEPPRA) tablet 500 mg  500 mg Oral BID Milagros Loll, MD   500 mg at 11/19/15 0944  . LORazepam (ATIVAN) injection 1 mg  1 mg Intravenous Q4H PRN Milagros Loll, MD   1 mg at 11/17/15 2249  . MEDLINE mouth  rinse  15 mL Mouth Rinse BID Wyatt Haste, MD   15 mL at 11/18/15 2200  . ondansetron (ZOFRAN) tablet 4 mg  4 mg Oral Q6H PRN Milagros Loll, MD       Or  . ondansetron (ZOFRAN) injection 4 mg  4 mg Intravenous Q6H PRN Srikar Sudini, MD      . polyethylene glycol (MIRALAX / GLYCOLAX) packet 17 g  17 g Oral Daily Aruna Gouru, MD      . QUEtiapine (SEROQUEL) tablet 100 mg  100 mg Oral BID Milagros Loll, MD   100 mg at 11/19/15 0944  . sodium chloride flush (NS) 0.9 % injection 10-40 mL  10-40 mL Intracatheter Q12H Srikar Sudini, MD   10 mL at 11/19/15 0945  . sodium chloride flush (NS) 0.9 % injection 10-40 mL  10-40 mL Intracatheter PRN Srikar Sudini, MD      . sodium chloride flush (NS) 0.9 % injection 3 mL  3 mL Intravenous Q12H Milagros Loll, MD   3 mL at 11/19/15 0946  . tamsulosin (FLOMAX) capsule 0.4 mg  0.4 mg Oral QHS Srikar Sudini, MD   0.4 mg at 11/18/15 2012     Discharge Medications: Please see discharge summary for a list of discharge medications.  Relevant Imaging Results:  Relevant Lab Results:   Additional Information SSN [id number]  Darleene Cleaver

## 2015-11-19 NOTE — Care Management (Signed)
TC to Maury DusDonna, Rn [phone]) at [name] to get further information on patient and his baseline needs. No answer. LM for return call.  Await return call

## 2015-11-19 NOTE — NC FL2 (Signed)
[place] MEDICAID FL2 LEVEL OF CARE SCREENING TOOL     IDENTIFICATION  Patient Name: Martin Randolph Birthdate: [birth year] Sex: male Admission Date (Current Location): 11/15/2015  [place]  [name] [id number] Q Facility and Address:  [facility], [address], [place], [place] [address]      Provider Number: [id number]  Attending Physician Name and Address:  Ramonita LabAruna Deundra Bard, MD  Relative Name and Phone Number:  [name],[name] Brother [phone]     Current Level of Care: Hospital Recommended Level of Care: Skilled Nursing Facility Prior Approval Number:    Date Approved/Denied:   PASRR Number: [id number]  Discharge Plan: SNF    Current Diagnoses: Patient Active Problem List   Diagnosis Date Noted  . Protein-calorie malnutrition, severe 11/16/2015  . Hyperglycemia   . Hypotension   . Bradycardia   . Nausea and vomiting   . Diabetic ketoacidosis without coma associated with type 1 diabetes mellitus (HCC) 11/15/2015  . Pressure injury of skin 11/15/2015  . Seizure (HCC)   . Hypoglycemia 10/29/2015  . Diabetes (HCC) 10/29/2015  . Down syndrome 10/29/2015    Orientation RESPIRATION BLADDER Height & Weight     Self  Normal Incontinent Weight: 109 lb 9.6 oz (49.7 kg) Height:  5\' 3"  (160 cm)  BEHAVIORAL SYMPTOMS/MOOD NEUROLOGICAL BOWEL NUTRITION STATUS    Convulsions/Seizures Continent Diet (dysphagia 3)  AMBULATORY STATUS COMMUNICATION OF NEEDS Skin   Limited Assist Non-Verbally PU Stage and Appropriate Care PU Stage 1 Dressing: Daily                     Personal Care Assistance Level of Assistance  Bathing, Dressing Bathing Assistance: Limited assistance   Dressing Assistance: Limited assistance     Functional Limitations Info  Sight, Hearing, Speech Sight Info: Adequate Hearing Info: Adequate Speech Info: Impaired    SPECIAL CARE FACTORS FREQUENCY  PT (By licensed PT)     PT Frequency: Home Health 2x  a week              Contractures Contractures Info: Present    Additional Factors Info  Insulin Sliding Scale Code Status Info: DNR Allergies Info: NKA   Insulin Sliding Scale Info: insulin glargine (LANTUS) injection 13 Units 13 Units, Subcutaneous, Daily at bedtime  ; insulin aspart (novoLOG) injection 0-5 Units 0-5 Units, Subcutaneous, Daily at bedtime  ; insulin aspart (novoLOG) injection 0-9 Units 0-9 Units, Subcutaneous, 3 times daily with meals  ; insulin aspart (novoLOG) injection 3 Units 3 Units, Subcutaneous, 3 times daily with meals         Current Medications (11/19/2015):  This is the current hospital active medication list Current Facility-Administered Medications  Medication Dose Route Frequency Provider Last Rate Last Dose  . 0.9 %  sodium chloride infusion   Intravenous Continuous Ramonita LabAruna Izaha Shughart, MD 75 mL/hr at 11/19/15 1623    . acetaminophen (TYLENOL) tablet 650 mg  650 mg Oral Q6H PRN Milagros LollSrikar Sudini, MD   650 mg at 11/18/15 2336   Or  . acetaminophen (TYLENOL) suppository 650 mg  650 mg Rectal Q6H PRN Srikar Sudini, MD      . albuterol (PROVENTIL) (2.5 MG/3ML) 0.083% nebulizer solution 2.5 mg  2.5 mg Nebulization Q2H PRN Srikar Sudini, MD      . diazepam (VALIUM) tablet 2 mg  2 mg Oral QHS Milagros LollSrikar Sudini, MD   2 mg at 11/18/15 2012  . enoxaparin (LOVENOX) injection 40 mg  40 mg Subcutaneous Q24H Milagros LollSrikar Sudini, MD  40 mg at 11/19/15 1509  . feeding supplement (GLUCERNA SHAKE) (GLUCERNA SHAKE) liquid 237 mL  237 mL Oral TID BM Wyatt Haste, MD   237 mL at 11/19/15 1400  . gabapentin (NEURONTIN) capsule 400 mg  400 mg Oral QHS Milagros Loll, MD   400 mg at 11/18/15 2012  . ibuprofen (ADVIL,MOTRIN) tablet 400 mg  400 mg Oral Q6H PRN Srikar Sudini, MD      . insulin aspart (novoLOG) injection 0-5 Units  0-5 Units Subcutaneous QHS Arletha Marschke, MD      . insulin aspart (novoLOG) injection 0-9 Units  0-9 Units Subcutaneous TID WC Gidget Quizhpi, MD      . insulin aspart  (novoLOG) injection 3 Units  3 Units Subcutaneous TID WC Wyatt Haste, MD   3 Units at 11/19/15 1220  . insulin glargine (LANTUS) injection 13 Units  13 Units Subcutaneous QHS Ramonita Lab, MD      . levETIRAcetam (KEPPRA) tablet 500 mg  500 mg Oral BID Milagros Loll, MD   500 mg at 11/19/15 0944  . LORazepam (ATIVAN) injection 1 mg  1 mg Intravenous Q4H PRN Milagros Loll, MD   1 mg at 11/17/15 2249  . MEDLINE mouth rinse  15 mL Mouth Rinse BID Wyatt Haste, MD   15 mL at 11/18/15 2200  . multivitamin liquid 15 mL  15 mL Oral Daily Ramonita Lab, MD      . ondansetron (ZOFRAN) tablet 4 mg  4 mg Oral Q6H PRN Srikar Sudini, MD       Or  . ondansetron (ZOFRAN) injection 4 mg  4 mg Intravenous Q6H PRN Srikar Sudini, MD      . polyethylene glycol (MIRALAX / GLYCOLAX) packet 17 g  17 g Oral Daily Ramonita Lab, MD   17 g at 11/19/15 1509  . QUEtiapine (SEROQUEL) tablet 100 mg  100 mg Oral BID Milagros Loll, MD   100 mg at 11/19/15 0944  . sodium chloride flush (NS) 0.9 % injection 10-40 mL  10-40 mL Intracatheter Q12H Srikar Sudini, MD   10 mL at 11/19/15 0945  . sodium chloride flush (NS) 0.9 % injection 10-40 mL  10-40 mL Intracatheter PRN Srikar Sudini, MD      . sodium chloride flush (NS) 0.9 % injection 3 mL  3 mL Intravenous Q12H Milagros Loll, MD   3 mL at 11/19/15 0946  . tamsulosin (FLOMAX) capsule 0.4 mg  0.4 mg Oral QHS Srikar Sudini, MD   0.4 mg at 11/18/15 2012     Discharge Medications: Please see discharge summary for a list of discharge medications.  Relevant Imaging Results:  Relevant Lab Results:   Additional Information SSN [id number]  Darleene Cleaver

## 2015-11-20 LAB — BASIC METABOLIC PANEL
Anion gap: 6 (ref 5–15)
BUN: 15 mg/dL (ref 6–20)
CALCIUM: 8.6 mg/dL — AB (ref 8.9–10.3)
CHLORIDE: 106 mmol/L (ref 101–111)
CO2: 27 mmol/L (ref 22–32)
CREATININE: 0.6 mg/dL — AB (ref 0.61–1.24)
GFR calc Af Amer: >60 mL/min (ref >60)
GFR calc non Af Amer: >60 mL/min (ref >60)
Glucose, Bld: 65 mg/dL (ref 65–99)
Potassium: 3.7 mmol/L (ref 3.5–5.1)
Sodium: 139 mmol/L (ref 135–145)

## 2015-11-20 LAB — CULTURE, BLOOD (ROUTINE X 2)
CULTURE: NO GROWTH
CULTURE: NO GROWTH

## 2015-11-20 LAB — CBC
HEMATOCRIT: 35.1 % — AB (ref 40.0–52.0)
HEMOGLOBIN: 12 g/dL — AB (ref 13.0–18.0)
MCH: 35 pg — AB (ref 26.0–34.0)
MCHC: 34.2 g/dL (ref 32.0–36.0)
MCV: 102.5 fL — AB (ref 80.0–100.0)
Platelets: 173 10*3/uL (ref 150–440)
RBC: 3.43 MIL/uL — ABNORMAL LOW (ref 4.40–5.90)
RDW: 14.6 % — AB (ref 11.5–14.5)
WBC: 4.6 10*3/uL (ref 3.8–10.6)

## 2015-11-20 LAB — MAGNESIUM: Magnesium: 2.1 mg/dL (ref 1.7–2.4)

## 2015-11-20 LAB — PHOSPHORUS
PHOSPHORUS: 3.9 mg/dL (ref 2.5–4.6)
Phosphorus: 5.7 mg/dL — ABNORMAL HIGH (ref 2.5–4.6)

## 2015-11-20 LAB — GLUCOSE, CAPILLARY
GLUCOSE-CAPILLARY: 110 mg/dL — AB (ref 65–99)
GLUCOSE-CAPILLARY: 356 mg/dL — AB (ref 65–99)

## 2015-11-20 MED ORDER — ALBUTEROL SULFATE (2.5 MG/3ML) 0.083% IN NEBU: 2.5000 mg | INHALATION_SOLUTION | Freq: Four times a day (QID) | RESPIRATORY_TRACT | 12 refills | Status: AC | PRN

## 2015-11-20 MED ORDER — DIAZEPAM 2 MG PO TABS: 2.0000 mg | ORAL_TABLET | Freq: Every day | ORAL | 0 refills | Status: AC

## 2015-11-20 MED ORDER — GLUCERNA SHAKE PO LIQD: 237.0000 mL | Freq: Two times a day (BID) | ORAL | Status: DC

## 2015-11-20 MED ORDER — ADULT MULTIVITAMIN LIQUID CH: 15.0000 mL | Freq: Every day | ORAL | 0 refills | Status: AC

## 2015-11-20 MED ORDER — QUETIAPINE FUMARATE 50 MG PO TABS: 100.0000 mg | ORAL_TABLET | Freq: Two times a day (BID) | ORAL | 0 refills | Status: AC

## 2015-11-20 MED ORDER — INSULIN GLARGINE 100 UNIT/ML ~~LOC~~ SOLN: 13.0000 [IU] | Freq: Every day | SUBCUTANEOUS | 11 refills | Status: DC

## 2015-11-20 NOTE — Progress Notes (Signed)
NS at 7375ml/hr  for 10 hours over night with an intake total of 750ml

## 2015-11-20 NOTE — Clinical Social Work Note (Signed)
MSW spoke with patient's family in regards to patient going to a SNF.  Patient's family have agreed on having [facility] [place].  [facility]missions [facility] at [facility] [place] completed assessment for patient and have accepted to have him come to SNF.  MSW notified physican, the group home where he was living, and bedside nurse.  Patient to be d/c'ed today [facility] [place] [facility].  Patient and family agreeable to plans will transport via brother's car RN to call report to [phone].  Windell MouldingEric Laurrie Toppin, MSW Mon-Fri 8a-4:30p [phone]

## 2015-11-20 NOTE — Discharge Instructions (Signed)
Activity as tolerated Diet diabetic with Glucerna shakes 3 times a day Follow-up with primary care physician in 3-5 days Follow-up with endocrinology Dr. Tedd Siassolum in a week

## 2015-11-20 NOTE — Progress Notes (Signed)
Inpatient Diabetes Program Recommendations  AACE/ADA: New Consensus Statement on Inpatient Glycemic Control (2015)  Target Ranges:  Prepandial:   less than 140 mg/dL      Peak postprandial:   less than 180 mg/dL (1-2 hours)      Critically ill patients:  140 - 180 mg/dL   Lab Results  Component Value Date   GLUCAP 110 (H) 11/20/2015   HGBA1C 8.4 (H) 11/15/2015   Results for WILBURT, MESSINA (MRN [id number]) as of 11/20/2015 08:00  Ref. Range 11/19/2015 07:51 11/19/2015 12:09 11/19/2015 17:10 11/19/2015 21:40 11/20/2015 07:52  Glucose-Capillary Latest Ref Range: 65 - 99 mg/dL 233 (H) 127 (H) 69 341 (H) 110 (H)    Diabetes history: DM1 Outpatient Diabetes medications: Lantus 14 units QHS, Novolog 5 units TID with meals, Metformin 1000 mg BID Current orders for Inpatient glycemic control: Lantus 13 units QHS, Novolog 3 units TID with meals, Novolog 0-9 units TID with meals, Novolog 0-5 units qhs  Inpatient Diabetes Program Recommendations:Agree with current medications for blood sugar management.    Please note that patient has Type 1 diabetes and absolutely requires basal insulin every 24 hours (as well as insulin for meal coverage and correction).    NURSING: If patient eats at least 50% of meal, please administer Novolog meal coverage as ordered when parameters are met.   Gentry Fitz, RN, BA, MHA, CDE Diabetes Coordinator Inpatient Diabetes Program  [phone] (Team Pager) [phone] ([place]) 11/20/2015 8:02 AM

## 2015-11-20 NOTE — Progress Notes (Signed)
Nutrition Follow-up  DOCUMENTATION CODES:   Severe malnutrition in context of acute illness/injury  INTERVENTION:  Decreased Glucerna Shake po to BID, each supplement provides 220 kcal and 10 grams of protein. This is adequate to meet needs with improved intake and all patient can drink.   Added note in Health[facility] to provide water on each tray per patient's mother's request.   NUTRITION DIAGNOSIS:   Malnutrition related to acute illness as evidenced by percent weight loss, moderate depletions of muscle mass, moderate depletion of body fat.  Ongoing.  GOAL:   Patient will meet greater than or equal to 90% of their needs  Met.  MONITOR:   PO intake, Supplement acceptance, Labs, Weight trends  REASON FOR ASSESSMENT:   Malnutrition Screening Tool    ASSESSMENT:   50 yo male admitted with DKA, AKI, acute encephalopathy with baseline dementia, down syndrome. Pt nonverbal at baseline  -Discharge order was in for yesterday, but new plan is for patient to discharge to higher level of care than the group home he was at.   Patient's mother present at time of assessment. She reports his appetite is much better now and he is having 3 meals per day and finishing most of them. She reports that he has not been having any nausea or abdominal pain. He has only been able to drink Glucerna 2x/day between meals because he is too full for the third one. She is requesting water be sent on every tray for patient so he can drink more fluids.   Meal Completion: 25-100% per chart. In the past 24 hrs, patient has had 1596 kcal (99% minimum estimated kcal needs) and 86 grams protein (100% estimated protein needs).   Medications reviewed and include: Novolog 3 units TID with meals, Novolog sliding scale TID with meals, Lantus 13 units daily at bedtime, multivitamin daily, Miralax.  Labs reviewed: CBG 69-341 past 24 hrs, Phosphorus 5.7.  Discussed plan with RN.   Diet Order:  DIET DYS 3 Room  service appropriate? Yes with Assist; Fluid consistency: Thin  Skin:   (stage I buttock)  Last BM:  11/17/2015  Height:   Ht Readings from Last 1 Encounters:  11/15/15 5' 3"  (1.6 m)    Weight:   Wt Readings from Last 1 Encounters:  11/20/15 110 lb (49.9 kg)    Ideal Body Weight:     BMI:  Body mass index is 19.49 kg/m.  Estimated Nutritional Needs:   Kcal:  5797-2820 kcals  Protein:  64-75 g  Fluid:  >/= 1.6 L  EDUCATION NEEDS:   No education needs identified at this time  Willey Blade, MS, RD, LDN Pager: [phone] After Hours Pager: [phone]

## 2015-11-20 NOTE — Clinical Social Work Placement (Signed)
   CLINICAL SOCIAL WORK PLACEMENT  NOTE  Date:  11/20/2015  Patient Details  Name: Martin Randolph MRN: [id number] Date of Birth: [birth year]  Clinical Social Work is seeking post-discharge placement for this patient at the Skilled  Nursing Facility level of care (*CSW will initial, date and re-position this form in  chart as items are completed):  Yes   Patient/family provided with [facility] Social Work Department's list of facilities offering this level of care within the geographic area requested by the patient (or if unable, by the patient's family).  Yes   Patient/family informed of their freedom to choose among providers that offer the needed level of care, that participate in Medicare, Medicaid or managed care program needed by the patient, have an available bed and are willing to accept the patient.  Yes   Patient/family informed of [facility]'s ownership interest in [facility] and [facility], as well as of the fact that they are under no obligation to receive care at these facilities.  PASRR submitted to EDS on 11/19/15     PASRR number received on 11/20/15     Existing PASRR number confirmed on       FL2 transmitted to all facilities in geographic area requested by pt/family on 11/19/15     FL2 transmitted to all facilities within larger geographic area on       Patient informed that his/her managed care company has contracts with or will negotiate with certain facilities, including the following:        Yes   Patient/family informed of bed offers received.  Patient [facility] bed at [facility] [place]     Physician recommends and patient chooses bed at      Patient to be [facility] to [facility] [place] on 11/20/15.  Patient to be transferred to facility by Patient's brother's car     Patient family notified on 11/20/15 of transfer.  Name of family member notified:  [name],[name] Brother [phone]      PHYSICIAN Please sign DNR,  Please sign FL2     Additional Comment:    _______________________________________________ Martin CleaverAnterhaus, Martin Randolph 11/20/2015, 4:43 PM

## 2015-11-20 NOTE — Progress Notes (Signed)
Called nurse-to-nurse report at this time to receiving facility. [facility]

## 2015-11-20 NOTE — Progress Notes (Signed)
MEDICATION RELATED CONSULT NOTE - INITIAL   Pharmacy Consult for Electrolyte management  Indication: DKA patient  No Known Allergies  Patient Measurements: Height: 5\' 3"  (160 cm) Weight: 110 lb (49.9 kg) IBW/kg (Calculated) : 56.9 Adjusted Body Weight:   Vital Signs: Temp: 97.5 F (36.4 C) (10/17 0530) Temp Source: Oral (10/17 0530) BP: 101/61 (10/17 0530) Pulse Rate: 67 (10/17 0530) Intake/Output from previous day: 10/16 0701 - 10/17 0700 In: 526.3 [I.V.:526.3] Out: 2200 [Urine:2200] Intake/Output from this shift: No intake/output data recorded.  Labs:  Recent Labs  11/18/15 0550 11/19/15 0548 11/19/15 1458 11/20/15 0447  WBC  --   --   --  4.6  HGB  --   --   --  12.0*  HCT  --   --   --  35.1*  PLT  --   --   --  173  CREATININE 0.81 0.70 0.60* 0.60*  MG 2.1 2.1  --  2.1  PHOS 3.6 4.5  --  5.7*   Estimated Creatinine Clearance: 77.1 mL/min (by C-G formula based on SCr of 0.6 mg/dL (L)).   Microbiology: Recent Results (from the past 720 hour(s))  MRSA PCR Screening     Status: None   Collection Time: 10/30/15  4:59 AM  Result Value Ref Range Status   MRSA by PCR NEGATIVE NEGATIVE Final    Comment:        The GeneXpert MRSA Assay (FDA approved for NASAL specimens only), is one component of a comprehensive MRSA colonization surveillance program. It is not intended to diagnose MRSA infection nor to guide or monitor treatment for MRSA infections.   Blood culture (routine x 2)     Status: None (Preliminary result)   Collection Time: 11/15/15 11:51 AM  Result Value Ref Range Status   Specimen Description BLOOD LEFT FOREARM  Final   Special Requests   Final    BOTTLES DRAWN AEROBIC AND ANAEROBIC AER 9ML ANA 9ML   Culture NO GROWTH 4 DAYS  Final   Report Status PENDING  Incomplete  Blood culture (routine x 2)     Status: None (Preliminary result)   Collection Time: 11/15/15 11:51 AM  Result Value Ref Range Status   Specimen Description BLOOD  RIGHT  FOREARM  Final   Special Requests   Final    BOTTLES DRAWN AEROBIC AND ANAEROBIC  AER 8ML ANA 7ML   Culture NO GROWTH 4 DAYS  Final   Report Status PENDING  Incomplete  MRSA PCR Screening     Status: None   Collection Time: 11/15/15  4:19 PM  Result Value Ref Range Status   MRSA by PCR NEGATIVE NEGATIVE Final    Comment:        The GeneXpert MRSA Assay (FDA approved for NASAL specimens only), is one component of a comprehensive MRSA colonization surveillance program. It is not intended to diagnose MRSA infection nor to guide or monitor treatment for MRSA infections.     Medical History: Past Medical History:  Diagnosis Date  . Alzheimer's dementia   . Diabetes (HCC)   . Down syndrome   . Hyperlipidemia   . Hypothyroidism   . Nonverbal    a. Since age [age] following fall with skull fracture.  . Seizures (HCC)     Medications:  Prescriptions Prior to Admission  Medication Sig Dispense Refill Last Dose  . acetaminophen (TYLENOL) 325 MG tablet Take 650 mg by mouth every 6 (six) hours as needed for mild pain.  prn at prn  . carbamide peroxide (DEBROX) 6.5 % otic solution Place 5-10 drops into both ears 2 (two) times daily.   10/29/2015 at Unknown time  . cetaphil (CETAPHIL) lotion Apply 1 application topically 2 (two) times daily.   10/29/2015 at Unknown time  . dextrose (GLUTOSE) 40 % GEL Take 37.5 g by mouth once as needed for low blood sugar. 1 Tube 2   . donepezil (ARICEPT) 10 MG tablet Take 10 mg by mouth at bedtime.   Past Week at Unknown time  . famotidine (PEPCID) 20 MG tablet Take 20 mg by mouth daily.   10/28/2015 at Unknown time  . feeding supplement, GLUCERNA SHAKE, (GLUCERNA SHAKE) LIQD Take 237 mLs by mouth 3 (three) times daily between meals.   10/29/2015 at Unknown time  . gabapentin (NEURONTIN) 400 MG capsule Take 400 mg by mouth at bedtime.   10/28/2015 at Unknown time  . insulin aspart (NOVOLOG) 100 UNIT/ML injection Inject 5 Units into the skin 3 (three) times  daily with meals. 10 mL 11   . insulin glargine (LANTUS) 100 UNIT/ML injection Inject 0.14 mLs (14 Units total) into the skin at bedtime. 10 mL 11   . levETIRAcetam (KEPPRA) 500 MG tablet Take 1 tablet (500 mg total) by mouth 2 (two) times daily. 60 tablet 3   . Melatonin 3 MG TABS Take 3 mg by mouth at bedtime.   10/28/2015 at Unknown time  . metFORMIN (GLUCOPHAGE) 1000 MG tablet Take 1,000 mg by mouth 2 (two) times daily with a meal.   10/28/2015 at Unknown time  . polyethylene glycol (MIRALAX / GLYCOLAX) packet Take 17 g by mouth every 3 (three) days.   Past Month at Unknown time  . QUEtiapine (SEROQUEL) 25 MG tablet Take 25 mg by mouth at bedtime.   Past Week at Unknown time  . QUEtiapine (SEROQUEL) 25 MG tablet Take 25 mg by mouth 2 (two) times daily as needed (agitation).   prn at prn  . QUEtiapine (SEROQUEL) 50 MG tablet Take 50 mg by mouth 2 (two) times daily.   10/29/2015 at Unknown time  . simvastatin (ZOCOR) 10 MG tablet Take 10 mg by mouth at bedtime.   10/28/2015 at Unknown time  . tamsulosin (FLOMAX) 0.4 MG CAPS capsule Take 0.4 mg by mouth at bedtime.   10/28/2015 at Unknown time   Scheduled:  . diazepam  2 mg Oral QHS  . enoxaparin (LOVENOX) injection  40 mg Subcutaneous Q24H  . feeding supplement (GLUCERNA SHAKE)  237 mL Oral TID BM  . gabapentin  400 mg Oral QHS  . insulin aspart  0-5 Units Subcutaneous QHS  . insulin aspart  0-9 Units Subcutaneous TID WC  . insulin aspart  3 Units Subcutaneous TID WC  . insulin glargine  13 Units Subcutaneous QHS  . levETIRAcetam  500 mg Oral BID  . mouth rinse  15 mL Mouth Rinse BID  . multivitamin  15 mL Oral Daily  . polyethylene glycol  17 g Oral Daily  . QUEtiapine  100 mg Oral BID  . sodium chloride flush  10-40 mL Intracatheter Q12H  . sodium chloride flush  3 mL Intravenous Q12H  . tamsulosin  0.4 mg Oral QHS    Assessment: Pharmacy consulted to manage electrolytes in this DKA patient while patient is on Insulin gtt.   Goal of  Therapy:  K >4.0   Plan:  Currently K = 3.5; therefore will supplement. Will give KCl 10 mEq IV x 4  doses. Will recheck K @ 20:00.   10/13:  K @ 20:21 = 4.4   10/14 0520 K 3.8. Will give potassium chloride 20 mEq PO BID with meals x 2 doses and recheck electrolytes tomorrow with AM labs.  10/15 0550 K 4.8, Mg 2.1, phos 3.6. No further supplementation warranted. Will recheck electrolytes with AM labs.  10/16 0622 K 3.9, Mg 2.1, phos 4.5. No further supplementation warranted. Will recheck electrolytes with AM labs.  10/17 0730 pt no longer on insulin gtt. K 3.7, Mg 2.1, phos 5.7.  10/17 1329 Rechecked phos = 3.9. No further supplementation needed.  Horris Latino, PharmD Pharmacy Resident 11/20/2015 2:10 PM

## 2015-11-20 NOTE — Care Management Important Message (Signed)
Important Message  Patient Details  Name: Martin Randolph MRN: [id number] Date of Birth: [birth year]   Medicare Important Message Given:  Yes    Marily MemosLisa M Jamielynn Wigley, RN 11/20/2015, 3:34 PM

## 2015-12-19 ENCOUNTER — Emergency Department
Admission: EM | Admit: 2015-12-19 | Discharge: 2015-12-19 | Disposition: A | Discharge status: Home / Self Care | Payer: Medicare (Managed Care) | Attending: Emergency Medicine | Admitting: Emergency Medicine

## 2015-12-19 DIAGNOSIS — F0280 Dementia in other diseases classified elsewhere without behavioral disturbance: Secondary | ICD-10-CM | POA: Insufficient documentation

## 2015-12-19 DIAGNOSIS — E109 Type 1 diabetes mellitus without complications: Secondary | ICD-10-CM | POA: Diagnosis not present

## 2015-12-19 DIAGNOSIS — E860 Dehydration: Secondary | ICD-10-CM | POA: Diagnosis not present

## 2015-12-19 DIAGNOSIS — G308 Other Alzheimer's disease: Secondary | ICD-10-CM | POA: Insufficient documentation

## 2015-12-19 DIAGNOSIS — E039 Hypothyroidism, unspecified: Secondary | ICD-10-CM | POA: Insufficient documentation

## 2015-12-19 LAB — COMPREHENSIVE METABOLIC PANEL
ALK PHOS: 110 U/L (ref 38–126)
ALT: 37 U/L (ref 17–63)
ANION GAP: 10 (ref 5–15)
AST: 37 U/L (ref 15–41)
Albumin: 3.4 g/dL — ABNORMAL LOW (ref 3.5–5.0)
BILIRUBIN TOTAL: 0.6 mg/dL (ref 0.3–1.2)
BUN: 25 mg/dL — ABNORMAL HIGH (ref 6–20)
CALCIUM: 8.9 mg/dL (ref 8.9–10.3)
CO2: 29 mmol/L (ref 22–32)
Chloride: 106 mmol/L (ref 101–111)
Creatinine, Ser: 0.93 mg/dL (ref 0.61–1.24)
GFR calc non Af Amer: >60 mL/min (ref >60)
Glucose, Bld: 92 mg/dL (ref 65–99)
Potassium: 4.3 mmol/L (ref 3.5–5.1)
Sodium: 145 mmol/L (ref 135–145)
TOTAL PROTEIN: 7 g/dL (ref 6.5–8.1)

## 2015-12-19 LAB — CBC
HCT: 50.1 % (ref 40.0–52.0)
Hemoglobin: 16.4 g/dL (ref 13.0–18.0)
MCH: 33.4 pg (ref 26.0–34.0)
MCHC: 32.7 g/dL (ref 32.0–36.0)
MCV: 102.2 fL — ABNORMAL HIGH (ref 80.0–100.0)
Platelets: 198 10*3/uL (ref 150–440)
RBC: 4.9 MIL/uL (ref 4.40–5.90)
RDW: 14.3 % (ref 11.5–14.5)
WBC: 10.1 10*3/uL (ref 3.8–10.6)

## 2015-12-19 LAB — DIFFERENTIAL
BASOS ABS: 0 10*3/uL (ref 0–0.1)
BASOS PCT: 0 %
EOS ABS: 0.1 10*3/uL (ref 0–0.7)
Eosinophils Relative: 1 %
Lymphocytes Relative: 24 %
Lymphs Abs: 2.4 10*3/uL (ref 1.0–3.6)
MONOS PCT: 7 %
Monocytes Absolute: 0.7 10*3/uL (ref 0.2–1.0)
NEUTROS PCT: 68 %
Neutro Abs: 6.9 10*3/uL — ABNORMAL HIGH (ref 1.4–6.5)

## 2015-12-19 LAB — GLUCOSE, CAPILLARY
GLUCOSE-CAPILLARY: 69 mg/dL (ref 65–99)
Glucose-Capillary: 124 mg/dL — ABNORMAL HIGH (ref 65–99)
Glucose-Capillary: 97 mg/dL (ref 65–99)

## 2015-12-19 LAB — T4, FREE: FREE T4: 0.61 ng/dL (ref 0.61–1.12)

## 2015-12-19 LAB — TSH: TSH: 2.658 u[IU]/mL (ref 0.350–4.500)

## 2015-12-19 MED ORDER — SODIUM CHLORIDE 0.9 % IV BOLUS (SEPSIS)
500.0000 mL | Freq: Once | INTRAVENOUS | Status: AC
  Administered 2015-12-19: 500 mL via INTRAVENOUS

## 2015-12-19 NOTE — ED Notes (Addendum)
Lab tech at bedside, ok per Dr. Huel CoteQuigley for lab tech to draw CBC from finger stick.

## 2015-12-19 NOTE — ED Notes (Signed)
Two attempts at second IV unsuccessful. Able to draw blood off of existing IV for lab work.

## 2015-12-19 NOTE — ED Notes (Signed)
Lab called and stated lavender top had a clot in it and could not run test. Lab informed they need to send a phlebotomist at  This time since pt has been stuck by multiple nurses in ED.

## 2015-12-19 NOTE — ED Notes (Signed)
Pt sitting on stretcher, playing with cardiac leads and blanket. Pt offered pillow but denied wanting it. Pt offered water and took a few sips.

## 2015-12-19 NOTE — ED Provider Notes (Signed)
Time Seen: Approximately 1637  I have reviewed the triage notes  Chief Complaint: Dehydration   History of Present Illness: Martin Randolph is a 50 y.o. male who is [facility] in the and [place] via EMS for possible dehydration. The patient has a history of Down's syndrome and diabetes. He was later joined here by his brother who knows the patient well. He states he was with him this afternoon when he seemed to be somewhat "" out of it "" and not quite as awake and alert as he normally is. The patient's had attempts of lab work at that facility which were unsuccessful and since he still seemed to be not at 100% was sent here to the emergency department. The patient at the bedside and is able to follow commands but essentially is nonverbal with obviousfindings of Down syndrome. Discussion with his brother the patient seems to be back to his baseline after eating a meal here and some IV fluids.   Past Medical History:  Diagnosis Date  . Alzheimer's dementia   . Diabetes (HCC)   . Down syndrome   . Hyperlipidemia   . Hypothyroidism   . Nonverbal    a. Since age [age] following fall with skull fracture.  . Seizures [facility](HCC)     Patient Active Problem List   Diagnosis Date Noted  . Protein-calorie malnutrition, severe 11/16/2015  . Hyperglycemia   . Hypotension   . Bradycardia   . Nausea and vomiting   . Diabetic ketoacidosis without coma associated with type 1 diabetes mellitus (HCC) 11/15/2015  . Pressure injury of skin 11/15/2015  . Seizure (HCC)   . Hypoglycemia 10/29/2015  . Diabetes (HCC) 10/29/2015  . Down syndrome 10/29/2015    Past Surgical History:  Procedure Laterality Date  . DENTAL SURGERY      Past Surgical History:  Procedure Laterality Date  . DENTAL SURGERY      Current Outpatient Rx  . Order #: [id number] Class: Historical Med  . Order #: [id number] Class: Normal  . Order #: [id number] Class: Historical Med  . Order #: [id number] Class:  Historical Med  . Order #: [id number] Class: Normal  . Order #: [id number] Class: Print  . Order #: [id number] Class: Historical Med  . Order #: [id number] Class: Historical Med  . Order #: [id number] Class: Historical Med  . Order #: [id number] Class: Historical Med  . Order #: [id number] Class: Normal  . Order #: [id number] Class: Normal  . Order #: [id number] Class: Normal  . Order #: [id number] Class: Historical Med  . Order #: [id number] Class: Normal  . Order #: [id number] Class: Historical Med  . Order #: [id number] Class: Historical Med  . Order #: [id number] Class: Normal  . Order #: [id number] Class: Historical Med  . Order #: [id number] Class: Historical Med    Allergies:  Patient has no known allergies.  Family History: History reviewed. No pertinent family history.  Social History: Social History  Substance Use Topics  . Smoking status: Never Smoker  . Smokeless tobacco: Never Used  . Alcohol use No     Review of Systems:   10 point review of systems was performed and was otherwise negative: Review of systems mainly taken through the family and also EMS providers. Constitutional: No fever Eyes: No visual disturbances ENT: No sore throat, ear pain Cardiac: No chest pain Respiratory: No shortness of breath, wheezing, or stridor Abdomen: No abdominal pain, no vomiting, No diarrhea Endocrine: No weight loss, No night sweats Extremities: No peripheral edema, cyanosis Skin: No rashes, easy bruising Neurologic: No focal weakness,  trouble with speech or swollowing Urologic: No dysuria, Hematuria, or urinary frequency   Physical Exam:  ED Triage Vitals [12/19/15 1615]  Enc Vitals Group     BP 103/63     Pulse Rate 78     Resp 12     Temp 98.1 F (36.7 C)     Temp Source Oral     SpO2 100 %     Weight      Height      Head Circumference      Peak Flow      Pain Score      Pain Loc      Pain Edu?      Excl. in GC?     General: Awake , Alert , With Down's syndrome Head:  Normal cephalic , atraumatic Eyes: Pupils equal , round, reactive to light Nose/Throat: No nasal drainage, patent upper airway without erythema or exudate. Dry mucous membranes with poor dentition Neck: Supple, Full range of motion, No anterior adenopathy or palpable thyroid masses Lungs: Clear to ascultation without wheezes , rhonchi, or rales Heart: Regular rate, regular rhythm without murmurs , gallops , or rubs Abdomen: Soft, non tender without rebound, guarding , or rigidity; bowel sounds positive and symmetric in all 4 quadrants. No organomegaly .        Extremities: 2 plus symmetric pulses. No edema, clubbing or cyanosis Neurologic:  Motor symmetric without deficits, sensory intact Skin: warm, dry, no rashes   Labs:   All laboratory work was reviewed including any pertinent negatives or positives listed below:  Labs Reviewed  GLUCOSE, CAPILLARY - Abnormal; Notable for the following:       Result Value   Glucose-Capillary 124 (*)    All other components within normal limits  GLUCOSE, CAPILLARY  CBC WITH DIFFERENTIAL/PLATELET  COMPREHENSIVE METABOLIC PANEL  TSH  T4, FREE  CBC  DIFFERENTIAL    EKG:  ED ECG REPORT I, Jennye MoccasinBrian S Male Iafrate, the attending physician, personally viewed and interpreted this ECG.  Date: 12/19/2015 EKG Time: 1658 Rate: 67Rhythm: normal sinus rhythm QRS Axis: normal Intervals: Right bundle-branch block, left anterior fascicular block ST/T Wave abnormalities: normal Conduction Disturbances: none Narrative Interpretation: unremarkable No acute ischemic changes are noted   ED Course: Patient's stay here was uneventful and the patient had serial blood sugars obtained and seemed to stabilize after food intake here in emergency department. Apparently up a copy of the patient's labs for his brother to take with them for further follow-up as an outpatient. Patient seems to be dehydrated with an elevated BUN and creatinine ratio he was given IV fluids here in  emergency department. He appears to be awake and alert CAT scan of the head was not necessary. Clinical Course      Assessment:  Near syncope Dehydration Hypoglycemia     Plan:  Outpatient Patient was advised to return immediately if condition worsens. Patient was advised to follow up with their primary care physician or other specialized physicians involved in their outpatient care. The patient and/or family member/power of attorney had laboratory results reviewed at the bedside. All questions and concerns were addressed and appropriate discharge instructions were distributed by the nursing staff.            Jennye MoccasinBrian S Theo Krumholz, MD 12/19/15 [phone]

## 2015-12-19 NOTE — ED Notes (Signed)
Patient given meal tray. Family at bedside with patient assisting in feeding him. No other needs expressed at this time.

## 2015-12-19 NOTE — ED Triage Notes (Signed)
Pt presents [facility] [place] [facility] for possible [facility]. EMS stated that pt was about to fall and assisted down, states he was "out of it" Unsure if unresponsive or not. He received PO fluids and "perked up." Pt received about 12-16 oz of fluids. Per EMS pt brother is on the way and wants pt's thyroid checked. Pt is a DNR, has downs syndrome. Pt becomes agitated easily per EMS and tried to swing at EMT. Pt is restless in bed, but consolable to touch. Pt mumbles but speech incoherent.

## 2015-12-19 NOTE — Discharge Instructions (Signed)
Please return immediately if condition worsens. Please contact her primary physician or the physician you were given for referral. If you have any specialist physicians involved in her treatment and plan please also contact them. Thank you for using [facility] emergency [facility]. ° °

## 2015-12-19 NOTE — ED Notes (Signed)
Called EMS [facility]

## 2015-12-19 NOTE — ED Notes (Signed)
Attempted a second IV, veins blew.

## 2015-12-31 ENCOUNTER — Emergency Department
Admission: EM | Admit: 2015-12-31 | Discharge: 2015-12-31 | Disposition: A | Discharge status: Home / Self Care | Payer: Medicare (Managed Care) | Attending: Emergency Medicine | Admitting: Emergency Medicine

## 2015-12-31 ENCOUNTER — Emergency Department: Payer: Medicare (Managed Care)

## 2015-12-31 DIAGNOSIS — G309 Alzheimer's disease, unspecified: Secondary | ICD-10-CM | POA: Diagnosis not present

## 2015-12-31 DIAGNOSIS — Z79899 Other long term (current) drug therapy: Secondary | ICD-10-CM | POA: Diagnosis not present

## 2015-12-31 DIAGNOSIS — Q909 Down syndrome, unspecified: Secondary | ICD-10-CM | POA: Insufficient documentation

## 2015-12-31 DIAGNOSIS — E039 Hypothyroidism, unspecified: Secondary | ICD-10-CM | POA: Diagnosis not present

## 2015-12-31 DIAGNOSIS — E109 Type 1 diabetes mellitus without complications: Secondary | ICD-10-CM | POA: Insufficient documentation

## 2015-12-31 DIAGNOSIS — G40909 Epilepsy, unspecified, not intractable, without status epilepticus: Secondary | ICD-10-CM | POA: Diagnosis not present

## 2015-12-31 DIAGNOSIS — R569 Unspecified convulsions: Secondary | ICD-10-CM

## 2015-12-31 LAB — CBC WITH DIFFERENTIAL/PLATELET
BASOS PCT: 1 %
Basophils Absolute: 0.1 10*3/uL (ref 0–0.1)
EOS ABS: 0 10*3/uL (ref 0–0.7)
EOS PCT: 0 %
HCT: 38.1 % — ABNORMAL LOW (ref 40.0–52.0)
Hemoglobin: 12.9 g/dL — ABNORMAL LOW (ref 13.0–18.0)
Lymphocytes Relative: 12 %
Lymphs Abs: 1 10*3/uL (ref 1.0–3.6)
MCH: 34.7 pg — ABNORMAL HIGH (ref 26.0–34.0)
MCHC: 33.7 g/dL (ref 32.0–36.0)
MCV: 102.9 fL — ABNORMAL HIGH (ref 80.0–100.0)
MONO ABS: 0.7 10*3/uL (ref 0.2–1.0)
MONOS PCT: 8 %
NEUTROS PCT: 79 %
Neutro Abs: 6.7 10*3/uL — ABNORMAL HIGH (ref 1.4–6.5)
PLATELETS: 314 10*3/uL (ref 150–440)
RBC: 3.71 MIL/uL — ABNORMAL LOW (ref 4.40–5.90)
RDW: 14.3 % (ref 11.5–14.5)
WBC: 8.5 10*3/uL (ref 3.8–10.6)

## 2015-12-31 LAB — COMPREHENSIVE METABOLIC PANEL
ALK PHOS: 91 U/L (ref 38–126)
ALT: 18 U/L (ref 17–63)
AST: 32 U/L (ref 15–41)
Albumin: 2.9 g/dL — ABNORMAL LOW (ref 3.5–5.0)
Anion gap: 8 (ref 5–15)
BUN: 9 mg/dL (ref 6–20)
CALCIUM: 8.8 mg/dL — AB (ref 8.9–10.3)
CO2: 29 mmol/L (ref 22–32)
CREATININE: 0.83 mg/dL (ref 0.61–1.24)
Chloride: 100 mmol/L — ABNORMAL LOW (ref 101–111)
GFR calc non Af Amer: >60 mL/min (ref >60)
GLUCOSE: 245 mg/dL — AB (ref 65–99)
Potassium: 4 mmol/L (ref 3.5–5.1)
SODIUM: 137 mmol/L (ref 135–145)
Total Bilirubin: 0.7 mg/dL (ref 0.3–1.2)
Total Protein: 6.3 g/dL — ABNORMAL LOW (ref 6.5–8.1)

## 2015-12-31 LAB — LIPASE, BLOOD: LIPASE: 19 U/L (ref 11–51)

## 2015-12-31 MED ORDER — SODIUM CHLORIDE 0.9 % IV BOLUS (SEPSIS)
1000.0000 mL | Freq: Once | INTRAVENOUS | Status: AC
  Administered 2015-12-31: 1000 mL via INTRAVENOUS

## 2015-12-31 MED ORDER — SODIUM CHLORIDE 0.9 % IV BOLUS (SEPSIS): 1000.0000 mL | Freq: Once | INTRAVENOUS | Status: DC

## 2015-12-31 NOTE — ED Provider Notes (Addendum)
[facility] Emergency Department Provider Note  ____________________________________________  Time seen: Approximately 3:24 PM  I have reviewed the triage vital signs and the nursing notes.   HISTORY  Chief Complaint Seizures Level 5 caveat:  Portions of the history and physical were unable to be obtained due to the patient's chronic nonverbal state   HPI Martin Randolph is a 50 y.o. male sent to the ED for evaluation from the [facility] due to adventitious heart sounds auscultated today. He had a seizure which is typical for him, and has been in his usual state of health according to EMS report.no fevers or vomiting or diarrhea. No reported pain anywhere.  He was reportedly evaluated by his doctor over at the  A new splint heart sound and sent the patient to the ED for evaluation.     Past Medical History:  Diagnosis Date  . Alzheimer's dementia   . Diabetes (HCC)   . Down syndrome   . Hyperlipidemia   . Hypothyroidism   . Nonverbal    a. Since age [age] following fall with skull fracture.  . Seizures [facility](HCC)      Patient Active Problem List   Diagnosis Date Noted  . Protein-calorie malnutrition, severe 11/16/2015  . Hyperglycemia   . Hypotension   . Bradycardia   . Nausea and vomiting   . Diabetic ketoacidosis without coma associated with type 1 diabetes mellitus (HCC) 11/15/2015  . Pressure injury of skin 11/15/2015  . Seizure (HCC)   . Hypoglycemia 10/29/2015  . Diabetes (HCC) 10/29/2015  . Down syndrome 10/29/2015     Past Surgical History:  Procedure Laterality Date  . DENTAL SURGERY       Prior to Admission medications   Medication Sig Start Date End Date Taking? Authorizing Provider  acetaminophen (TYLENOL) 325 MG tablet Take 650 mg by mouth every 6 (six) hours as needed for mild pain.    Historical Provider, MD  albuterol (PROVENTIL) (2.5 MG/3ML) 0.083% nebulizer solution Take 3 mLs (2.5 mg total) by nebulization every 6 (six)  hours as needed for wheezing. 11/20/15   Ramonita LabAruna Gouru, MD  carbamide peroxide (DEBROX) 6.5 % otic solution Place 5-10 drops into both ears 2 (two) times daily.    Historical Provider, MD  cetaphil (CETAPHIL) lotion Apply 1 application topically 2 (two) times daily.    Historical Provider, MD  dextrose (GLUTOSE) 40 % GEL Take 37.5 g by mouth once as needed for low blood sugar. 11/03/15   Enedina FinnerSona Patel, MD  diazepam (VALIUM) 2 MG tablet Take 1 tablet (2 mg total) by mouth at bedtime. 11/20/15   Ramonita LabAruna Gouru, MD  donepezil (ARICEPT) 10 MG tablet Take 10 mg by mouth at bedtime.    Historical Provider, MD  famotidine (PEPCID) 20 MG tablet Take 20 mg by mouth daily.    Historical Provider, MD  feeding supplement, GLUCERNA SHAKE, (GLUCERNA SHAKE) LIQD Take 237 mLs by mouth 3 (three) times daily between meals.    Historical Provider, MD  gabapentin (NEURONTIN) 400 MG capsule Take 400 mg by mouth at bedtime.    Historical Provider, MD  insulin aspart (NOVOLOG) 100 UNIT/ML injection Inject 5 Units into the skin 3 (three) times daily with meals. 11/03/15   Enedina FinnerSona Patel, MD  insulin glargine (LANTUS) 100 UNIT/ML injection Inject 0.13 mLs (13 Units total) into the skin at bedtime. 11/20/15   Ramonita LabAruna Gouru, MD  levETIRAcetam (KEPPRA) 500 MG tablet Take 1 tablet (500 mg total) by mouth 2 (two) times daily.  11/03/15   Enedina Finner, MD  Melatonin 3 MG TABS Take 3 mg by mouth at bedtime.    Historical Provider, MD  Multiple Vitamin (MULTIVITAMIN) LIQD Take 15 mLs by mouth daily. 11/20/15   Ramonita Lab, MD  polyethylene glycol (MIRALAX / GLYCOLAX) packet Take 17 g by mouth every 3 (three) days.    Historical Provider, MD  QUEtiapine (SEROQUEL) 25 MG tablet Take 25 mg by mouth at bedtime.    Historical Provider, MD  QUEtiapine (SEROQUEL) 50 MG tablet Take 2 tablets (100 mg total) by mouth 2 (two) times daily. 11/20/15   Ramonita Lab, MD  simvastatin (ZOCOR) 10 MG tablet Take 10 mg by mouth at bedtime.    Historical Provider, MD   tamsulosin (FLOMAX) 0.4 MG CAPS capsule Take 0.4 mg by mouth at bedtime.    Historical Provider, MD     Allergies Patient has no known allergies.   No family history on file.  Social History Social History  Substance Use Topics  . Smoking status: Never Smoker  . Smokeless tobacco: Never Used  . Alcohol use No    Review of Systems Unable to obtain due to chronic nonverbal state ____________________________________________   PHYSICAL EXAM:  VITAL SIGNS: ED Triage Vitals  Enc Vitals Group     BP 12/31/15 1307 101/69     Pulse Rate 12/31/15 1307 85     Resp 12/31/15 1307 16     Temp 12/31/15 1307 99.4 F (37.4 C)     Temp Source 12/31/15 1307 Axillary     SpO2 12/31/15 1258 98 %     Weight 12/31/15 1309 110 lb (49.9 kg)     Height --      Head Circumference --      Peak Flow --      Pain Score --      Pain Loc --      Pain Edu? --      Excl. in GC? --     Vital signs reviewed, nursing assessments reviewed.   Constitutional:  Awake and alert. Well appearing and in no distress. Eyes:   No scleral icterus. No conjunctival pallor. PERRL. EOMI.  No nystagmus. ENT   Head:   Normocephalic and atraumatic.   Nose:   No congestion/rhinnorhea. No septal hematoma   Mouth/Throat:   MMM, no pharyngeal erythema. No peritonsillar mass.    Neck:   No stridor. No SubQ emphysema. No meningismus. Hematological/Lymphatic/Immunilogical:   No cervical lymphadenopathy. Cardiovascular:   RRR. Symmetric bilateral radial and DP pulses.  No murmurs. There is a split S1 and split S2 heard over the left lower sternum. Respiratory:   Normal respiratory effort without tachypnea nor retractions. Breath sounds are clear and equal bilaterally. No wheezes/rales/rhonchi. Gastrointestinal:   Soft and nontender. Non distended. There is no CVA tenderness.  No rebound, rigidity, or guarding. Genitourinary:   deferred Musculoskeletal:   Nontender with normal range of motion in all  extremities. No joint effusions.  No lower extremity tenderness.  No edema. Neurologic:   Normal speech and language.  CN 2-10 normal. Motor grossly intact. No gross focal neurologic deficits are appreciated.  Skin:    Skin is warm, dry and intact. No rash noted.  No petechiae, purpura, or bullae.  ____________________________________________    LABS (pertinent positives/negatives) (all labs ordered are listed, but only abnormal results are displayed) Labs Reviewed  COMPREHENSIVE METABOLIC PANEL - Abnormal; Notable for the following:       Result Value  Chloride 100 (*)    Glucose, Bld 245 (*)    Calcium 8.8 (*)    Total Protein 6.3 (*)    Albumin 2.9 (*)    All other components within normal limits  CBC WITH DIFFERENTIAL/PLATELET - Abnormal; Notable for the following:    RBC 3.71 (*)    Hemoglobin 12.9 (*)    HCT 38.1 (*)    MCV 102.9 (*)    MCH 34.7 (*)    Neutro Abs 6.7 (*)    All other components within normal limits  LIPASE, BLOOD  URINALYSIS COMPLETEWITH MICROSCOPIC ([facility] ONLY)   ____________________________________________   EKG  nterpreted by me Sinus rhythm rate of 85, right axis, slightly prolonged QRS width with right bundle branch block and associated repolarization abnormality. No acute ischemic changes.Unchanged from previous EKG on 12/19/2015  ____________________________________________    RADIOLOGY  Chest x-ray unremarkable  ____________________________________________   PROCEDURES Procedures  ____________________________________________   INITIAL IMPRESSION / ASSESSMENT AND PLAN / ED COURSE  Pertinent labs & imaging results that were available during my care of the patient were reviewed by me and considered in my medical decision making (see chart for details).  Patient well appearing, appears to be a chronic baseline. Labs and chest x-ray unremarkable, EKG unchanged.Brother at bedside, once to feed the patient, I think this is  reasonable.I think the patient is medically stable for discharge home and outpatient follow-up with cardiology for further evaluation. No evidence of any cardiac decompensation in the ED today, vital signs unremarkable, no evidence of CHF on exam or chest x-ray, no significant or acute dysrhythmia on EKG.low suspicion for acute infection such as meningitis encephalitis urinary tract infection pneumonia or soft tissue infection. Low suspicion for endocarditis Or chordae tendineae rupture or other acute structural heart abnormality     Clinical Course    ____________________________________________   FINAL CLINICAL IMPRESSION(S) / ED DIAGNOSES  Final diagnoses:  Seizure (HCC)  chronic nonverbal state Down syndrome     Portions of this note were generated with dragon dictation software. Dictation errors may occur despite best attempts at proofreading.    Sharman CheekPhillip Lamya Lausch, MD 12/31/15 1530    Sharman CheekPhillip Dillin Lofgren, MD 12/31/15 [phone]

## 2015-12-31 NOTE — ED Notes (Signed)
[facility]  [facility] indicated [facility] would come and pick patient up

## 2015-12-31 NOTE — ED Notes (Signed)
Patient's brother is at bedside. Dr. Scotty CourtStafford aware and gave permission for the brother to feed the patient the food that he brought in.

## 2015-12-31 NOTE — Discharge Instructions (Signed)
Your tests today were unremarkable. Follow-up with cardiology for further evaluation of your abnormal heart sounds.

## 2015-12-31 NOTE — ED Notes (Signed)
[facility] and stated that [facility] will be here in an hour to pick him up.

## 2015-12-31 NOTE — ED Notes (Signed)
Got pt dressed and ready for transport.

## 2015-12-31 NOTE — ED Notes (Addendum)
Patient's brother has left. Seizure pads remain in place, as well as bed alarm. Patient is dressed. Patient does not indicate he needs to void to staff or brother. Dr. Scotty CourtStafford indicated I&O cath is not warranted at this visit.

## 2015-12-31 NOTE — ED Notes (Signed)
Seizure pads in place

## 2015-12-31 NOTE — ED Triage Notes (Signed)
Per EMS report, Patient is a resident [facility] in [place] and had a seizure at 0800 today. Staff reported that patient recovered well and is now at baseline. Physician present stated that patient had a "new split heart sound" and wanted the patient evaluated.

## 2015-12-31 NOTE — ED Notes (Signed)
Given warm blanket 

## 2015-12-31 NOTE — ED Notes (Signed)
Bed alarm placed on patientl.

## 2015-12-31 NOTE — ED Notes (Signed)
Patient appears more alert at this time. Patient is calm. Warm blanket given to the patient.

## 2016-01-04 ENCOUNTER — Observation Stay
Admission: EM | Admit: 2016-01-04 | Discharge: 2016-01-06 | Disposition: A | Discharge status: Skilled Nursing Facility | Payer: Medicare (Managed Care) | Attending: Internal Medicine | Admitting: Internal Medicine

## 2016-01-04 ENCOUNTER — Emergency Department: Payer: Medicare (Managed Care)

## 2016-01-04 ENCOUNTER — Encounter: Payer: Self-pay | Admitting: Emergency Medicine

## 2016-01-04 DIAGNOSIS — Q909 Down syndrome, unspecified: Secondary | ICD-10-CM | POA: Insufficient documentation

## 2016-01-04 DIAGNOSIS — Z66 Do not resuscitate: Secondary | ICD-10-CM | POA: Insufficient documentation

## 2016-01-04 DIAGNOSIS — F0150 Vascular dementia without behavioral disturbance: Secondary | ICD-10-CM | POA: Diagnosis not present

## 2016-01-04 DIAGNOSIS — G40909 Epilepsy, unspecified, not intractable, without status epilepticus: Secondary | ICD-10-CM | POA: Diagnosis not present

## 2016-01-04 DIAGNOSIS — R6250 Unspecified lack of expected normal physiological development in childhood: Secondary | ICD-10-CM | POA: Insufficient documentation

## 2016-01-04 DIAGNOSIS — G309 Alzheimer's disease, unspecified: Secondary | ICD-10-CM | POA: Insufficient documentation

## 2016-01-04 DIAGNOSIS — W06XXXA Fall from bed, initial encounter: Secondary | ICD-10-CM | POA: Diagnosis not present

## 2016-01-04 DIAGNOSIS — L8990 Pressure ulcer of unspecified site, unspecified stage: Secondary | ICD-10-CM | POA: Diagnosis present

## 2016-01-04 DIAGNOSIS — Z79899 Other long term (current) drug therapy: Secondary | ICD-10-CM | POA: Diagnosis not present

## 2016-01-04 DIAGNOSIS — S0083XA Contusion of other part of head, initial encounter: Secondary | ICD-10-CM | POA: Diagnosis not present

## 2016-01-04 DIAGNOSIS — E43 Unspecified severe protein-calorie malnutrition: Secondary | ICD-10-CM | POA: Insufficient documentation

## 2016-01-04 DIAGNOSIS — E162 Hypoglycemia, unspecified: Secondary | ICD-10-CM | POA: Diagnosis present

## 2016-01-04 DIAGNOSIS — E785 Hyperlipidemia, unspecified: Secondary | ICD-10-CM | POA: Insufficient documentation

## 2016-01-04 DIAGNOSIS — F39 Unspecified mood [affective] disorder: Secondary | ICD-10-CM | POA: Insufficient documentation

## 2016-01-04 DIAGNOSIS — E10649 Type 1 diabetes mellitus with hypoglycemia without coma: Secondary | ICD-10-CM | POA: Diagnosis not present

## 2016-01-04 DIAGNOSIS — F0280 Dementia in other diseases classified elsewhere without behavioral disturbance: Secondary | ICD-10-CM | POA: Insufficient documentation

## 2016-01-04 DIAGNOSIS — Z794 Long term (current) use of insulin: Secondary | ICD-10-CM | POA: Insufficient documentation

## 2016-01-04 DIAGNOSIS — Z681 Body mass index (BMI) 19 or less, adult: Secondary | ICD-10-CM | POA: Insufficient documentation

## 2016-01-04 DIAGNOSIS — R339 Retention of urine, unspecified: Secondary | ICD-10-CM | POA: Insufficient documentation

## 2016-01-04 DIAGNOSIS — Y92122 Bedroom in nursing home as the place of occurrence of the external cause: Secondary | ICD-10-CM | POA: Insufficient documentation

## 2016-01-04 LAB — COMPREHENSIVE METABOLIC PANEL
ALK PHOS: 82 U/L (ref 38–126)
ALT: 18 U/L (ref 17–63)
AST: 22 U/L (ref 15–41)
Albumin: 3 g/dL — ABNORMAL LOW (ref 3.5–5.0)
Anion gap: 7 (ref 5–15)
BUN: 16 mg/dL (ref 6–20)
CALCIUM: 8.4 mg/dL — AB (ref 8.9–10.3)
CHLORIDE: 103 mmol/L (ref 101–111)
CO2: 27 mmol/L (ref 22–32)
CREATININE: 0.76 mg/dL (ref 0.61–1.24)
Glucose, Bld: 110 mg/dL — ABNORMAL HIGH (ref 65–99)
Potassium: 3.9 mmol/L (ref 3.5–5.1)
SODIUM: 137 mmol/L (ref 135–145)
Total Bilirubin: 0.6 mg/dL (ref 0.3–1.2)
Total Protein: 6.1 g/dL — ABNORMAL LOW (ref 6.5–8.1)

## 2016-01-04 LAB — CBC
HCT: 39.5 % — ABNORMAL LOW (ref 40.0–52.0)
HEMOGLOBIN: 13.2 g/dL (ref 13.0–18.0)
MCH: 34.2 pg — ABNORMAL HIGH (ref 26.0–34.0)
MCHC: 33.4 g/dL (ref 32.0–36.0)
MCV: 102.3 fL — AB (ref 80.0–100.0)
PLATELETS: 366 10*3/uL (ref 150–440)
RBC: 3.86 MIL/uL — AB (ref 4.40–5.90)
RDW: 14.3 % (ref 11.5–14.5)
WBC: 10.7 10*3/uL — AB (ref 3.8–10.6)

## 2016-01-04 LAB — GLUCOSE, CAPILLARY: Glucose-Capillary: 113 mg/dL — ABNORMAL HIGH (ref 65–99)

## 2016-01-04 MED ORDER — DEXTROSE-NACL 5-0.45 % IV SOLN
INTRAVENOUS | Status: DC
  Administered 2016-01-04 – 2016-01-05 (×2): via INTRAVENOUS

## 2016-01-04 NOTE — ED Triage Notes (Signed)
Pt to rm 9 [facility], report pt rolled out of bed, hematoma to right forehead, BG 40, given amp d50 and NS en route.  PT normally non-verbal, in NAD upon arrival.

## 2016-01-04 NOTE — ED Notes (Signed)
BS 113

## 2016-01-04 NOTE — ED Provider Notes (Signed)
[facility] Emergency Department Provider Note    First MD Initiated Contact with Patient 01/04/16 2312     (approximate)  I have reviewed the triage vital signs and the nursing notes.  History Limited secondary to nonverbal patient. HISTORY  Chief Complaint Hypoglycemia and Fall  Martin Randolph is a 50 y.o. male [facility] history of rolling out of bed with right forehead trauma.. Per EMS on their arrival patient noted to have a glucose of 40 and was given half amp of D50 with improvement of blood glucose to 130.   Past Medical History:  Diagnosis Date  . Alzheimer's dementia   . Diabetes (HCC)   . Down syndrome   . Hyperlipidemia   . Hypothyroidism   . Nonverbal    a. Since age [age] following fall with skull fracture.  . Seizures [facility](HCC)     Patient Active Problem List   Diagnosis Date Noted  . Protein-calorie malnutrition, severe 11/16/2015  . Hyperglycemia   . Hypotension   . Bradycardia   . Nausea and vomiting   . Diabetic ketoacidosis without coma associated with type 1 diabetes mellitus (HCC) 11/15/2015  . Pressure injury of skin 11/15/2015  . Seizure (HCC)   . Hypoglycemia 10/29/2015  . Diabetes (HCC) 10/29/2015  . Down syndrome 10/29/2015    Past Surgical History:  Procedure Laterality Date  . DENTAL SURGERY      Prior to Admission medications   Medication Sig Start Date End Date Taking? Authorizing Provider  acetaminophen (TYLENOL) 325 MG tablet Take 650 mg by mouth every 6 (six) hours as needed for mild pain.   Yes Historical Provider, MD  albuterol (PROVENTIL) (2.5 MG/3ML) 0.083% nebulizer solution Take 3 mLs (2.5 mg total) by nebulization every 6 (six) hours as needed for wheezing. 11/20/15  Yes Ramonita LabAruna Gouru, MD  carbamide peroxide (DEBROX) 6.5 % otic solution Place 5-10 drops into both ears 2 (two) times daily.   Yes Historical Provider, MD  cetaphil (CETAPHIL) lotion Apply 1 application topically 2  (two) times daily.   Yes Historical Provider, MD  dextrose (GLUTOSE) 40 % GEL Take 37.5 g by mouth once as needed for low blood sugar. 11/03/15  Yes Enedina FinnerSona Patel, MD  diazepam (VALIUM) 2 MG tablet Take 1 tablet (2 mg total) by mouth at bedtime. 11/20/15  Yes Ramonita LabAruna Gouru, MD  donepezil (ARICEPT) 10 MG tablet Take 10 mg by mouth at bedtime.   Yes Historical Provider, MD  famotidine (PEPCID) 20 MG tablet Take 20 mg by mouth daily.   Yes Historical Provider, MD  feeding supplement, GLUCERNA SHAKE, (GLUCERNA SHAKE) LIQD Take 237 mLs by mouth 3 (three) times daily between meals.   Yes Historical Provider, MD  gabapentin (NEURONTIN) 400 MG capsule Take 400 mg by mouth at bedtime.   Yes Historical Provider, MD  insulin aspart (NOVOLOG) 100 UNIT/ML injection Inject 5 Units into the skin 3 (three) times daily with meals. 11/03/15  Yes Enedina FinnerSona Patel, MD  insulin glargine (LANTUS) 100 UNIT/ML injection Inject 0.13 mLs (13 Units total) into the skin at bedtime. 11/20/15  Yes Ramonita LabAruna Gouru, MD  levETIRAcetam (KEPPRA) 500 MG tablet Take 1 tablet (500 mg total) by mouth 2 (two) times daily. 11/03/15  Yes Enedina FinnerSona Patel, MD  Melatonin 3 MG TABS Take 3 mg by mouth at bedtime.   Yes Historical Provider, MD  Multiple Vitamin (MULTIVITAMIN) LIQD Take 15 mLs by mouth daily. 11/20/15  Yes Ramonita LabAruna Gouru, MD  polyethylene  glycol (MIRALAX / GLYCOLAX) packet Take 17 g by mouth every 3 (three) days.   Yes Historical Provider, MD  QUEtiapine (SEROQUEL) 25 MG tablet Take 25 mg by mouth at bedtime.   Yes Historical Provider, MD  QUEtiapine (SEROQUEL) 50 MG tablet Take 2 tablets (100 mg total) by mouth 2 (two) times daily. 11/20/15  Yes Ramonita LabAruna Gouru, MD  simvastatin (ZOCOR) 10 MG tablet Take 10 mg by mouth at bedtime.   Yes Historical Provider, MD  tamsulosin (FLOMAX) 0.4 MG CAPS capsule Take 0.4 mg by mouth at bedtime.   Yes Historical Provider, MD    Allergies Patient has no known allergies.  History reviewed. No pertinent family  history.  Social History Social History  Substance Use Topics  . Smoking status: Never Smoker  . Smokeless tobacco: Never Used  . Alcohol use No    Review of Systems Constitutional: No fever/chills Eyes: No visual changes. ENT: No sore throat. Cardiovascular: Denies chest pain. Respiratory: Denies shortness of breath. Gastrointestinal: No abdominal pain.  No nausea, no vomiting.  No diarrhea.  No constipation. Genitourinary: Negative for dysuria. Musculoskeletal: Negative for back pain. Skin: Negative for rash. Positive for right forehead bruise Neurological: Negative for headaches, focal weakness or numbness.  10-point ROS otherwise negative.  ____________________________________________   PHYSICAL EXAM:  VITAL SIGNS: ED Triage Vitals [01/04/16 2309]  Enc Vitals Group     BP 113/60     Pulse Rate 72     Resp 18     Temp 97.3 F (36.3 C)     Temp Source Oral     SpO2 99 %     Weight 110 lb (49.9 kg)     Height      Head Circumference      Peak Flow      Pain Score      Pain Loc      Pain Edu?      Excl. in GC?     Constitutional: Alert  Eyes: Conjunctivae are normal. PERRL. EOMI. Head: Right forehead swelling ecchymoses tender to touch. Ears:  Healthy appearing ear canals and TMs bilaterally Nose: No congestion/rhinnorhea. Mouth/Throat: Mucous membranes are moist.  Oropharynx non-erythematous. Neck: No stridor.  No meningeal signs.  No cervical spine tenderness to palpation. Cardiovascular: Normal rate, regular rhythm. Good peripheral circulation. Grossly normal heart sounds. Respiratory: Normal respiratory effort.  No retractions. Lungs CTAB. Gastrointestinal: Soft and nontender. No distention.   Musculoskeletal: No lower extremity tenderness nor edema. No gross deformities of extremities. Neurologic:  Normal speech and language. No gross focal neurologic deficits are appreciated.  Skin:  Skin is warm, dry and intact. No rash noted. Psychiatric: Mood and  affect are normal. Speech and behavior are normal.  ____________________________________________   LABS (all labs ordered are listed, but only abnormal results are displayed)  Labs Reviewed  CBC - Abnormal; Notable for the following:       Result Value   WBC 10.7 (*)    RBC 3.86 (*)    HCT 39.5 (*)    MCV 102.3 (*)    MCH 34.2 (*)    All other components within normal limits  COMPREHENSIVE METABOLIC PANEL - Abnormal; Notable for the following:    Glucose, Bld 110 (*)    Calcium 8.4 (*)    Total Protein 6.1 (*)    Albumin 3.0 (*)    All other components within normal limits  GLUCOSE, CAPILLARY - Abnormal; Notable for the following:    Glucose-Capillary 113 (*)  All other components within normal limits  GLUCOSE, CAPILLARY - Abnormal; Notable for the following:    Glucose-Capillary 105 (*)    All other components within normal limits  GLUCOSE, CAPILLARY - Abnormal; Notable for the following:    Glucose-Capillary 129 (*)    All other components within normal limits  GLUCOSE, CAPILLARY - Abnormal; Notable for the following:    Glucose-Capillary 139 (*)    All other components within normal limits  GLUCOSE, CAPILLARY    RADIOLOGY I, [place] Davi Rotan, personally viewed and evaluated these images (plain radiographs) as part of my medical decision making, as well as reviewing the written report by the radiologist.  Ct Head Wo Contrast  Result Date: 01/05/2016 CLINICAL DATA:  Rolled out of bed hematoma to right forehead EXAM: CT HEAD WITHOUT CONTRAST TECHNIQUE: Contiguous axial images were obtained from the base of the skull through the vertex without intravenous contrast. COMPARISON:  10/31/2015 FINDINGS: Brain: No acute territorial infarction or intracranial hemorrhage is visualized. There is no focal mass, mass effect or midline shift. 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 Moderate atrophy. Enlarged ventricles are similar in configuration. Subcortical  hypodensity within the bifrontal white matter again noted. Tiny bubbles of air within the anterior venous sinus, likely iatrogenic. Vascular: No hyperdense vessel or unexpected calcification. Skull: Mastoid air cells are clear.  No skull fracture. Sinuses/Orbits: No acute finding. Other: Small to moderate right forehead soft tissue hematoma. IMPRESSION: 1. No definite CT evidence for acute intracranial abnormality. 2. Atrophy. Low attenuation within the bilateral frontal lobe white matter, unchanged, possibly posttraumatic. 3. Small right forehead hematoma. Electronically Signed   By: Jasmine Pang M.D.   On: 01/05/2016 00:11    ____________________________________________    Procedures    INITIAL IMPRESSION / ASSESSMENT AND PLAN / ED COURSE  Pertinent labs & imaging results that were available during my care of the patient were reviewed by me and considered in my medical decision making (see chart for details).  Patient started on D5 normal saline on arrival to emergency department a rate of 100. However patient had multiple episode of hypoglycemia resulting in decrease of the infusion rate of the D5 normal saline. Patient also required an additional half amp of D50 while in the emergency department. Dr. Frederica Kuster admission for further evaluation and management   Clinical Course     ____________________________________________  FINAL CLINICAL IMPRESSION(S) / ED DIAGNOSES  Final diagnoses:  Hypoglycemia     MEDICATIONS GIVEN DURING THIS VISIT:  Medications  dextrose 5 %-0.45 % sodium chloride infusion ( Intravenous Rate/Dose Change 01/05/16 0017)  dextrose 50 % solution 25 mL (25 mLs Intravenous Given 01/05/16 0050)     NEW OUTPATIENT MEDICATIONS STARTED DURING THIS VISIT:  New Prescriptions   No medications on file    Modified Medications   No medications on file    Discontinued Medications   No medications on file     Note:  This document was prepared  using Dragon voice recognition software and may include unintentional dictation errors.    Darci Current, MD 01/05/16 [phone]

## 2016-01-05 LAB — GLUCOSE, CAPILLARY
GLUCOSE-CAPILLARY: 105 mg/dL — AB (ref 65–99)
GLUCOSE-CAPILLARY: 139 mg/dL — AB (ref 65–99)
Glucose-Capillary: 129 mg/dL — ABNORMAL HIGH (ref 65–99)
Glucose-Capillary: 141 mg/dL — ABNORMAL HIGH (ref 65–99)
Glucose-Capillary: 164 mg/dL — ABNORMAL HIGH (ref 65–99)
Glucose-Capillary: 284 mg/dL — ABNORMAL HIGH (ref 65–99)
Glucose-Capillary: 337 mg/dL — ABNORMAL HIGH (ref 65–99)
Glucose-Capillary: 92 mg/dL (ref 65–99)
Glucose-Capillary: 93 mg/dL (ref 65–99)

## 2016-01-05 LAB — TSH: TSH: 6.045 u[IU]/mL — ABNORMAL HIGH (ref 0.350–4.500)

## 2016-01-05 MED ORDER — ONDANSETRON HCL 4 MG PO TABS: 4.0000 mg | ORAL_TABLET | Freq: Four times a day (QID) | ORAL | Status: DC | PRN

## 2016-01-05 MED ORDER — SODIUM CHLORIDE 0.9 % IV SOLN
INTRAVENOUS | Status: DC
  Administered 2016-01-05 – 2016-01-06 (×3): via INTRAVENOUS

## 2016-01-05 MED ORDER — CETAPHIL MOISTURIZING EX LOTN
1.0000 "application " | TOPICAL_LOTION | Freq: Two times a day (BID) | CUTANEOUS | Status: DC
  Filled 2016-01-05: qty 118

## 2016-01-05 MED ORDER — MELATONIN 5 MG PO TABS
2.5000 mg | ORAL_TABLET | Freq: Every day | ORAL | Status: DC
  Filled 2016-01-05 (×2): qty 0.5

## 2016-01-05 MED ORDER — QUETIAPINE FUMARATE 25 MG PO TABS
25.0000 mg | ORAL_TABLET | Freq: Every day | ORAL | Status: DC
  Administered 2016-01-05: 25 mg via ORAL
  Filled 2016-01-05: qty 1

## 2016-01-05 MED ORDER — LEVETIRACETAM 500 MG PO TABS
500.0000 mg | ORAL_TABLET | Freq: Two times a day (BID) | ORAL | Status: DC
Start: 2016-01-05 — End: 2016-01-06
  Administered 2016-01-05 – 2016-01-06 (×3): 500 mg via ORAL
  Filled 2016-01-05 (×3): qty 1

## 2016-01-05 MED ORDER — ACETAMINOPHEN 650 MG RE SUPP: 650.0000 mg | Freq: Four times a day (QID) | RECTAL | Status: DC | PRN

## 2016-01-05 MED ORDER — DOCUSATE SODIUM 100 MG PO CAPS
100.0000 mg | ORAL_CAPSULE | Freq: Two times a day (BID) | ORAL | Status: DC
  Administered 2016-01-05 – 2016-01-06 (×3): 100 mg via ORAL
  Filled 2016-01-05 (×3): qty 1

## 2016-01-05 MED ORDER — INSULIN ASPART 100 UNIT/ML ~~LOC~~ SOLN
0.0000 [IU] | Freq: Three times a day (TID) | SUBCUTANEOUS | Status: DC
  Administered 2016-01-05: 7 [IU] via SUBCUTANEOUS
  Administered 2016-01-05: 1 [IU] via SUBCUTANEOUS
  Administered 2016-01-05 – 2016-01-06 (×2): 2 [IU] via SUBCUTANEOUS
  Administered 2016-01-06: 5 [IU] via SUBCUTANEOUS
  Filled 2016-01-05: qty 7
  Filled 2016-01-05: qty 5
  Filled 2016-01-05 (×2): qty 2
  Filled 2016-01-05: qty 1

## 2016-01-05 MED ORDER — ENOXAPARIN SODIUM 40 MG/0.4ML ~~LOC~~ SOLN
40.0000 mg | SUBCUTANEOUS | Status: DC
  Administered 2016-01-05: 21:00:00 40 mg via SUBCUTANEOUS
  Filled 2016-01-05: qty 0.4

## 2016-01-05 MED ORDER — CARBAMIDE PEROXIDE 6.5 % OT SOLN
5.0000 [drp] | Freq: Two times a day (BID) | OTIC | Status: DC
  Administered 2016-01-05 – 2016-01-06 (×3): 5 [drp] via OTIC
  Filled 2016-01-05: qty 15

## 2016-01-05 MED ORDER — INSULIN GLARGINE 100 UNIT/ML ~~LOC~~ SOLN
4.0000 [IU] | Freq: Every day | SUBCUTANEOUS | Status: DC
  Administered 2016-01-05: 21:00:00 4 [IU] via SUBCUTANEOUS
  Filled 2016-01-05 (×2): qty 0.04

## 2016-01-05 MED ORDER — GLUCOSE 40 % PO GEL: 1.0000 | Freq: Once | ORAL | Status: DC | PRN

## 2016-01-05 MED ORDER — INSULIN GLARGINE 100 UNIT/ML ~~LOC~~ SOLN
8.0000 [IU] | Freq: Every day | SUBCUTANEOUS | Status: DC
  Filled 2016-01-05: qty 0.08

## 2016-01-05 MED ORDER — TAMSULOSIN HCL 0.4 MG PO CAPS
0.4000 mg | ORAL_CAPSULE | Freq: Every day | ORAL | Status: DC
  Administered 2016-01-05: 21:00:00 0.4 mg via ORAL
  Filled 2016-01-05: qty 1

## 2016-01-05 MED ORDER — ACETAMINOPHEN 325 MG PO TABS: 650.0000 mg | ORAL_TABLET | Freq: Four times a day (QID) | ORAL | Status: DC | PRN

## 2016-01-05 MED ORDER — GLUCERNA SHAKE PO LIQD
237.0000 mL | Freq: Three times a day (TID) | ORAL | Status: DC
  Administered 2016-01-05 – 2016-01-06 (×4): 237 mL via ORAL

## 2016-01-05 MED ORDER — FAMOTIDINE 20 MG PO TABS
20.0000 mg | ORAL_TABLET | Freq: Every day | ORAL | Status: DC
  Administered 2016-01-05 – 2016-01-06 (×2): 20 mg via ORAL
  Filled 2016-01-05 (×2): qty 1

## 2016-01-05 MED ORDER — ALBUTEROL SULFATE (2.5 MG/3ML) 0.083% IN NEBU: 2.5000 mg | INHALATION_SOLUTION | RESPIRATORY_TRACT | Status: DC | PRN

## 2016-01-05 MED ORDER — DIAZEPAM 2 MG PO TABS
2.0000 mg | ORAL_TABLET | Freq: Every day | ORAL | Status: DC
  Administered 2016-01-05: 21:00:00 2 mg via ORAL
  Filled 2016-01-05: qty 1

## 2016-01-05 MED ORDER — DEXTROSE 50 % IV SOLN
INTRAVENOUS | Status: AC
  Administered 2016-01-05: 25 mL via INTRAVENOUS
  Filled 2016-01-05: qty 50

## 2016-01-05 MED ORDER — DONEPEZIL HCL 5 MG PO TABS
10.0000 mg | ORAL_TABLET | Freq: Every day | ORAL | Status: DC
  Administered 2016-01-05: 10 mg via ORAL
  Filled 2016-01-05: qty 2

## 2016-01-05 MED ORDER — GABAPENTIN 300 MG PO CAPS
400.0000 mg | ORAL_CAPSULE | Freq: Every day | ORAL | Status: DC
Start: 2016-01-05 — End: 2016-01-06
  Administered 2016-01-05: 400 mg via ORAL
  Filled 2016-01-05: qty 1

## 2016-01-05 MED ORDER — HYDROCERIN EX CREA
TOPICAL_CREAM | Freq: Two times a day (BID) | CUTANEOUS | Status: DC
  Administered 2016-01-06: 10:00:00 via TOPICAL
  Filled 2016-01-05: qty 113

## 2016-01-05 MED ORDER — ONDANSETRON HCL 4 MG/2ML IJ SOLN: 4.0000 mg | Freq: Four times a day (QID) | INTRAMUSCULAR | Status: DC | PRN

## 2016-01-05 MED ORDER — MELATONIN 3 MG PO TABS: 3.0000 mg | ORAL_TABLET | Freq: Every day | ORAL | Status: DC

## 2016-01-05 MED ORDER — SIMVASTATIN 20 MG PO TABS
10.0000 mg | ORAL_TABLET | Freq: Every day | ORAL | Status: DC
  Administered 2016-01-05: 21:00:00 10 mg via ORAL
  Filled 2016-01-05: qty 1

## 2016-01-05 MED ORDER — ADULT MULTIVITAMIN LIQUID CH
15.0000 mL | Freq: Every day | ORAL | Status: DC
  Administered 2016-01-05 – 2016-01-06 (×2): 15 mL via ORAL
  Filled 2016-01-05 (×2): qty 15

## 2016-01-05 MED ORDER — POLYETHYLENE GLYCOL 3350 17 G PO PACK
17.0000 g | PACK | ORAL | Status: DC
  Administered 2016-01-05: 17 g via ORAL
  Filled 2016-01-05: qty 1

## 2016-01-05 MED ORDER — QUETIAPINE FUMARATE 25 MG PO TABS
100.0000 mg | ORAL_TABLET | Freq: Two times a day (BID) | ORAL | Status: DC
Start: 2016-01-05 — End: 2016-01-06
  Administered 2016-01-05 – 2016-01-06 (×3): 100 mg via ORAL
  Filled 2016-01-05 (×3): qty 4

## 2016-01-05 MED ORDER — DEXTROSE 50 % IV SOLN
25.0000 mL | Freq: Once | INTRAVENOUS | Status: AC
  Administered 2016-01-05: 25 mL via INTRAVENOUS

## 2016-01-05 NOTE — ED Notes (Signed)
Pt's mother called, given update, request call with any changes [phone]

## 2016-01-05 NOTE — Care Management Obs Status (Signed)
MEDICARE OBSERVATION STATUS NOTIFICATION   Patient Details  Name: Martin Randolph MRN: [id number] Date of Birth: Apr 16, 1965   Medicare Observation Status Notification Given:  Yes (Mother Martin Randolph Guardian [phone] or [phone] verbalized understanding letter / copy left at bedside / Jana HalfFiona will have son Martin Randolph pick up form)    Caren MacadamMichelle Ersa Delaney, RN 01/05/2016, 4:48 PM

## 2016-01-05 NOTE — Progress Notes (Signed)
[facility] [phone], He has a guardian ( Mother Ms Manuela SchwartzFiona Acevedo [phone]) His health care power of attorney is his brother Phillips Groutsaac Sweetin [phone]. This information was provided to ED care manager. ED LCSW will hand off information to weekend LCSW  Venus Ruhe [phone]

## 2016-01-05 NOTE — Progress Notes (Signed)
[employer] [facility] [place] [facility]                                                                                                                                                                                  Patient Demographics   Martin Randolph, is a 50 y.o. male, DOB - 09-Sep-1965, ZOX:[id number]  Admit date - 01/04/2016   Admitting Physician Arnaldo NatalMichael S Diamond, MD  Outpatient Primary MD for the patient is Pasty Spillersracy N McLean-Scocuzza, MD   LOS - 0  Subjective: Patient admitted with a fall and hypoglycemia now sugars are in the 300s is on D5 containing fluid and his sister is at bedside. She reports that this is his third admission within the past few months. Previously he was admitted with dehydration. His [facility]. She reports that he has good appetite however is unable to eat on his own and needs help. Currently he is sleeping and not able to answer any questions    Review of Systems:   CONSTITUTIONAL: Unable to provide  Vitals:   Vitals:   01/05/16 0300 01/05/16 0332 01/05/16 0359 01/05/16 0604  BP: 115/83 115/83 94/82   Pulse:  60 82   Resp: 15 15 19    Temp:   98.2 F (36.8 C)   TempSrc:   Oral   SpO2:  100% 100%   Weight:    104 lb (47.2 kg)    Wt Readings from Last 3 Encounters:  01/05/16 104 lb (47.2 kg)  12/31/15 110 lb (49.9 kg)  11/20/15 110 lb (49.9 kg)     Intake/Output Summary (Last 24 hours) at 01/05/16 1318 Last data filed at 01/05/16 0423  Gross per 24 hour  Intake           701.67 ml  Output                0 ml  Net           701.67 ml    Physical Exam:   GENERAL: Pleasant-appearing in no apparent distress.  HEAD, EYES, EARS, NOSE AND THROAT: Atraumatic, normocephalic. Pupils equal and reactive to light. Sclerae anicteric. No conjunctival injection. No oro-pharyngeal erythema.  NECK: Supple. There is no jugular venous distention. No bruits, no lymphadenopathy, no thyromegaly.  HEART: Regular rate and rhythm,.  No murmurs, no rubs, no clicks.  LUNGS: Clear to auscultation bilaterally. No rales or rhonchi. No wheezes.  ABDOMEN: Soft, flat, nontender, nondistended. Has good bowel sounds. No hepatosplenomegaly appreciated.  EXTREMITIES: No evidence of any cyanosis, clubbing, or peripheral edema.  +2 pedal and radial pulses bilaterally.  NEUROLOGIC: The patient is sleepy.  SKIN: Moist and warm  with no rashes appreciated.  Psych: Not anxious, depressed LN: No inguinal LN enlargement    Antibiotics   Anti-infectives    None      Medications   Scheduled Meds: . carbamide peroxide  5-10 drop Both Ears BID  . diazepam  2 mg Oral QHS  . docusate sodium  100 mg Oral BID  . donepezil  10 mg Oral QHS  . enoxaparin (LOVENOX) injection  40 mg Subcutaneous Q24H  . famotidine  20 mg Oral Daily  . feeding supplement (GLUCERNA SHAKE)  237 mL Oral TID BM  . gabapentin  400 mg Oral QHS  . hydrocerin   Topical BID  . insulin aspart  0-9 Units Subcutaneous TID WC  . insulin glargine  4 Units Subcutaneous QHS  . levETIRAcetam  500 mg Oral BID  . Melatonin  2.5 mg Oral QHS  . multivitamin  15 mL Oral Daily  . polyethylene glycol  17 g Oral Q3 days  . QUEtiapine  100 mg Oral BID  . QUEtiapine  25 mg Oral QHS  . simvastatin  10 mg Oral QHS  . tamsulosin  0.4 mg Oral QHS   Continuous Infusions: . sodium chloride 100 mL/hr at 01/05/16 1231   PRN Meds:.acetaminophen **OR** acetaminophen, albuterol, dextrose, ondansetron **OR** ondansetron (ZOFRAN) IV   Data Review:   Micro Results No results found for this or any previous visit (from the past 240 hour(s)).  Radiology Reports Ct Head Wo Contrast  Result Date: 01/05/2016 CLINICAL DATA:  Rolled out of bed hematoma to right forehead EXAM: CT HEAD WITHOUT CONTRAST TECHNIQUE: Contiguous axial images were obtained from the base of the skull through the vertex without intravenous contrast. COMPARISON:  10/31/2015 FINDINGS: Brain: No acute territorial  infarction or intracranial hemorrhage is visualized. There is no focal mass, mass effect or midline shift. 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 Moderate atrophy. Enlarged ventricles are similar in configuration. Subcortical hypodensity within the bifrontal white matter again noted. Tiny bubbles of air within the anterior venous sinus, likely iatrogenic. Vascular: No hyperdense vessel or unexpected calcification. Skull: Mastoid air cells are clear.  No skull fracture. Sinuses/Orbits: No acute finding. Other: Small to moderate right forehead soft tissue hematoma. IMPRESSION: 1. No definite CT evidence for acute intracranial abnormality. 2. Atrophy. Low attenuation within the bilateral frontal lobe white matter, unchanged, possibly posttraumatic. 3. Small right forehead hematoma. Electronically Signed   By: Jasmine PangKim  Fujinaga M.D.   On: 01/05/2016 00:11   Dg Chest Portable 1 View  Result Date: 12/31/2015 CLINICAL DATA:  Seizure EXAM: PORTABLE CHEST 1 VIEW COMPARISON:  11/15/2015 FINDINGS: The heart size and mediastinal contours are within normal limits. Both lungs are clear. The visualized skeletal structures are unremarkable. IMPRESSION: No active disease. Electronically Signed   By: Marlan Palauharles  Clark M.D.   On: 12/31/2015 14:38     CBC  Recent Labs Lab 12/31/15 1340 01/04/16 2316  WBC 8.5 10.7*  HGB 12.9* 13.2  HCT 38.1* 39.5*  PLT 314 366  MCV 102.9* 102.3*  MCH 34.7* 34.2*  MCHC 33.7 33.4  RDW 14.3 14.3  LYMPHSABS 1.0  --   MONOABS 0.7  --   EOSABS 0.0  --   BASOSABS 0.1  --     Chemistries   Recent Labs Lab 12/31/15 1340 01/04/16 2316  NA 137 137  K 4.0 3.9  CL 100* 103  CO2 29 27  GLUCOSE 245* 110*  BUN 9 16  CREATININE 0.83 0.76  CALCIUM 8.8*  8.4*  AST 32 22  ALT 18 18  ALKPHOS 91 82  BILITOT 0.7 0.6   ------------------------------------------------------------------------------------------------------------------ estimated creatinine clearance is  72.9 mL/min (by C-G formula based on SCr of 0.76 mg/dL). ------------------------------------------------------------------------------------------------------------------ No results for input(s): HGBA1C in the last 72 hours. ------------------------------------------------------------------------------------------------------------------ No results for input(s): CHOL, HDL, LDLCALC, TRIG, CHOLHDL, LDLDIRECT in the last 72 hours. ------------------------------------------------------------------------------------------------------------------  Recent Labs  01/04/16 2316  TSH 6.045*   ------------------------------------------------------------------------------------------------------------------ No results for input(s): VITAMINB12, FOLATE, FERRITIN, TIBC, IRON, RETICCTPCT in the last 72 hours.  Coagulation profile No results for input(s): INR, PROTIME in the last 168 hours.  No results for input(s): DDIMER in the last 72 hours.  Cardiac Enzymes No results for input(s): CKMB, TROPONINI, MYOGLOBIN in the last 168 hours.  Invalid input(s): CK ------------------------------------------------------------------------------------------------------------------ Invalid input(s): POCBNP    Assessment & Plan  Patient is 50 y.o with down syndrome and vascular dementia  1. Hypoglycemia: Suspect related to poor by mouth intake , his blood sugars are now elevated I will stop his D5 containing fluids 2. Diabetes mellitus type 1: Restart lower dose Lantus and continue sliding scale for now 3. Seizure disorder: Continue Keppra 4. Progressive cognitive decline: Continue Aricept 5. Mood disorder: Associated with developmental delay; continue Seroquel and Valium (also used for contractures) 6. Hyperlipidemia: Continue statin therapy 7. Urinary retention: Continue tamsulosin 8. DVT prophylaxis: Lovenox 9. GI prophylaxis: Pepcid        Code Status Orders        Start     Ordered    01/05/16 0415  Do not attempt resuscitation (DNR)  Continuous    Question Answer Comment  In the event of cardiac or respiratory ARREST Do not call a "code blue"   In the event of cardiac or respiratory ARREST Do not perform Intubation, CPR, defibrillation or ACLS   In the event of cardiac or respiratory ARREST Use medication by any route, position, wound care, and other measures to relive pain and suffering. May use oxygen, suction and manual treatment of airway obstruction as needed for comfort.      01/05/16 0414    Code Status History    Date Active Date Inactive Code Status Order ID Comments User Context   11/15/2015  1:54 PM 11/20/2015  9:12 PM DNR [id number]  Milagros Loll, MD ED   11/15/2015  1:09 PM 11/15/2015  1:54 PM Full Code [id number]  Milagros Loll, MD ED   10/29/2015 11:07 PM 11/03/2015  5:04 PM Full Code [id number]  Oralia Manis, MD ED    Advance Directive Documentation   Flowsheet Row Most Recent Value  Type of Advance Directive  Out of facility DNR (pink MOST or yellow form)  Pre-existing out of facility DNR order (yellow form or pink MOST form)  Pink MOST form placed in chart (order not valid for inpatient use)  "MOST" Form in Place?  No data           Consults None DVT Prophylaxis  Lovenox  Lab Results  Component Value Date   PLT 366 01/04/2016     Time Spent in minutes  45 minutes  Greater than 50% of time spent in care coordination and counseling patient regarding the condition and plan of care.   Auburn Bilberry M.D on 01/05/2016 at 1:18 PM  Between 7am to 6pm - Pager - [phone]  After 6pm go to [email or web address] - password [email or web address] [facility]  [facility]   Office  [phone]

## 2016-01-05 NOTE — H&P (Signed)
Martin Randolph is an 50 y.o. male.   Chief Complaint: Fall HPI: The patient with past medical history of Down syndrome and diabetes presents to the emergency department after a fall. The patient was found in the floor at his long-term care facility. Initial assessment revealed hypoglycemia. The patient was given an amp of D50 as well as dextrose via continuous infusion in the emergency department. However his blood sugar continually fluctuated which prompted the emergency department staff to call the hospitalist service for further management.  Past Medical History:  Diagnosis Date  . Alzheimer's dementia   . Diabetes ([place])   . Down syndrome   . Hyperlipidemia   . Hypothyroidism   . Nonverbal    a. Since age 33 following fall with skull fracture.  . Seizures ([place])     Past Surgical History:  Procedure Laterality Date  . DENTAL SURGERY      History reviewed. No pertinent family history. Unknown as family not available for interview Social History:  reports that he has never smoked. He has never used smokeless tobacco. He reports that he does not drink alcohol or use drugs.  Allergies: No Known Allergies  Medications Prior to Admission  Medication Sig Dispense Refill  . acetaminophen (TYLENOL) 325 MG tablet Take 650 mg by mouth every 6 (six) hours as needed for mild pain.    Marland Kitchen albuterol (PROVENTIL) (2.5 MG/3ML) 0.083% nebulizer solution Take 3 mLs (2.5 mg total) by nebulization every 6 (six) hours as needed for wheezing. 75 mL 12  . carbamide peroxide (DEBROX) 6.5 % otic solution Place 5-10 drops into both ears 2 (two) times daily.    . cetaphil (CETAPHIL) lotion Apply 1 application topically 2 (two) times daily.    Marland Kitchen dextrose (GLUTOSE) 40 % GEL Take 37.5 g by mouth once as needed for low blood sugar. 1 Tube 2  . diazepam (VALIUM) 2 MG tablet Take 1 tablet (2 mg total) by mouth at bedtime. 30 tablet 0  . donepezil (ARICEPT) 10 MG tablet Take 10 mg by mouth at bedtime.    . famotidine  (PEPCID) 20 MG tablet Take 20 mg by mouth daily.    . feeding supplement, GLUCERNA SHAKE, (GLUCERNA SHAKE) LIQD Take 237 mLs by mouth 3 (three) times daily between meals.    . gabapentin (NEURONTIN) 400 MG capsule Take 400 mg by mouth at bedtime.    . insulin aspart (NOVOLOG) 100 UNIT/ML injection Inject 5 Units into the skin 3 (three) times daily with meals. 10 mL 11  . insulin glargine (LANTUS) 100 UNIT/ML injection Inject 0.13 mLs (13 Units total) into the skin at bedtime. 10 mL 11  . levETIRAcetam (KEPPRA) 500 MG tablet Take 1 tablet (500 mg total) by mouth 2 (two) times daily. 60 tablet 3  . Melatonin 3 MG TABS Take 3 mg by mouth at bedtime.    . Multiple Vitamin (MULTIVITAMIN) LIQD Take 15 mLs by mouth daily. 473 mL 0  . polyethylene glycol (MIRALAX / GLYCOLAX) packet Take 17 g by mouth every 3 (three) days.    Marland Kitchen QUEtiapine (SEROQUEL) 25 MG tablet Take 25 mg by mouth at bedtime.    Marland Kitchen QUEtiapine (SEROQUEL) 50 MG tablet Take 2 tablets (100 mg total) by mouth 2 (two) times daily. 60 tablet 0  . simvastatin (ZOCOR) 10 MG tablet Take 10 mg by mouth at bedtime.    . tamsulosin (FLOMAX) 0.4 MG CAPS capsule Take 0.4 mg by mouth at bedtime.      Results  for orders placed or performed during the hospital encounter of 01/04/16 (from the past 48 hour(s))  CBC     Status: Abnormal   Collection Time: 01/04/16 11:16 PM  Result Value Ref Range   WBC 10.7 (H) 3.8 - 10.6 K/uL   RBC 3.86 (L) 4.40 - 5.90 MIL/uL   Hemoglobin 13.2 13.0 - 18.0 g/dL   HCT 39.5 (L) 40.0 - 52.0 %   MCV 102.3 (H) 80.0 - 100.0 fL   MCH 34.2 (H) 26.0 - 34.0 pg   MCHC 33.4 32.0 - 36.0 g/dL   RDW 14.3 11.5 - 14.5 %   Platelets 366 150 - 440 K/uL  Comprehensive metabolic panel     Status: Abnormal   Collection Time: 01/04/16 11:16 PM  Result Value Ref Range   Sodium 137 135 - 145 mmol/L   Potassium 3.9 3.5 - 5.1 mmol/L   Chloride 103 101 - 111 mmol/L   CO2 27 22 - 32 mmol/L   Glucose, Bld 110 (H) 65 - 99 mg/dL   BUN 16 6 -  20 mg/dL   Creatinine, Ser 0.76 0.61 - 1.24 mg/dL   Calcium 8.4 (L) 8.9 - 10.3 mg/dL   Total Protein 6.1 (L) 6.5 - 8.1 g/dL   Albumin 3.0 (L) 3.5 - 5.0 g/dL   AST 22 15 - 41 U/L   ALT 18 17 - 63 U/L   Alkaline Phosphatase 82 38 - 126 U/L   Total Bilirubin 0.6 0.3 - 1.2 mg/dL   GFR calc non Af Amer >60 >60 mL/min   GFR calc Af Amer >60 >60 mL/min    Comment: (NOTE) The eGFR has been calculated using the CKD EPI equation. This calculation has not been validated in all clinical situations. eGFR's persistently <60 mL/min signify possible Chronic Kidney Disease.    Anion gap 7 5 - 15  Glucose, capillary     Status: Abnormal   Collection Time: 01/04/16 11:16 PM  Result Value Ref Range   Glucose-Capillary 113 (H) 65 - 99 mg/dL  TSH     Status: Abnormal   Collection Time: 01/04/16 11:16 PM  Result Value Ref Range   TSH 6.045 (H) 0.350 - 4.500 uIU/mL    Comment: Performed by a 3rd Generation assay with a functional sensitivity of <=0.01 uIU/mL.  Glucose, capillary     Status: Abnormal   Collection Time: 01/05/16 12:15 AM  Result Value Ref Range   Glucose-Capillary 105 (H) 65 - 99 mg/dL  Glucose, capillary     Status: None   Collection Time: 01/05/16 12:48 AM  Result Value Ref Range   Glucose-Capillary 92 65 - 99 mg/dL  Glucose, capillary     Status: Abnormal   Collection Time: 01/05/16  1:23 AM  Result Value Ref Range   Glucose-Capillary 129 (H) 65 - 99 mg/dL  Glucose, capillary     Status: Abnormal   Collection Time: 01/05/16  1:51 AM  Result Value Ref Range   Glucose-Capillary 139 (H) 65 - 99 mg/dL  Glucose, capillary     Status: None   Collection Time: 01/05/16  3:02 AM  Result Value Ref Range   Glucose-Capillary 93 65 - 99 mg/dL   Ct Head Wo Contrast  Result Date: 01/05/2016 CLINICAL DATA:  Rolled out of bed hematoma to right forehead EXAM: CT HEAD WITHOUT CONTRAST TECHNIQUE: Contiguous axial images were obtained from the base of the skull through the vertex without  intravenous contrast. COMPARISON:  10/31/2015 FINDINGS: Brain: No acute territorial infarction  or intracranial hemorrhage is visualized. There is no focal mass, mass effect or midline shift. 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 Moderate atrophy. Enlarged ventricles are similar in configuration. Subcortical hypodensity within the bifrontal white matter again noted. Tiny bubbles of air within the anterior venous sinus, likely iatrogenic. Vascular: No hyperdense vessel or unexpected calcification. Skull: Mastoid air cells are clear.  No skull fracture. Sinuses/Orbits: No acute finding. Other: Small to moderate right forehead soft tissue hematoma. IMPRESSION: 1. No definite CT evidence for acute intracranial abnormality. 2. Atrophy. Low attenuation within the bilateral frontal lobe white matter, unchanged, possibly posttraumatic. 3. Small right forehead hematoma. Electronically Signed   By: Donavan Foil M.D.   On: 01/05/2016 00:11    Review of Systems  Unable to perform ROS: Patient nonverbal    Blood pressure 94/82, pulse 82, temperature 98.2 F (36.8 C), temperature source Oral, resp. rate 19, weight 47.2 kg (104 lb), SpO2 100 %. Physical Exam  Constitutional: He appears well-developed and well-nourished. No distress.  HENT:  Head: Normocephalic and atraumatic.  Mouth/Throat: Oropharynx is clear and moist.  Eyes: Conjunctivae and EOM are normal. Pupils are equal, round, and reactive to light. No scleral icterus.  Neck: Normal range of motion. Neck supple. No JVD present. No tracheal deviation present. No thyromegaly present.  Cardiovascular: Normal rate, regular rhythm and normal heart sounds.  Exam reveals no gallop and no friction rub.   No murmur heard. Respiratory: Effort normal and breath sounds normal. No respiratory distress.  GI: Soft. Bowel sounds are normal. He exhibits no distension. There is no tenderness.  Genitourinary:  Genitourinary Comments: Deferred   Musculoskeletal: Normal range of motion. He exhibits no edema.  Lymphadenopathy:    He has no cervical adenopathy.  Neurological: He is alert. No cranial nerve deficit.  Cannot assess if patient is oriented as he is nonverbal  Skin: Skin is warm and dry. No rash noted. No erythema.  Psychiatric:  Difficult to assess mental status as the patient is nonverbal     Assessment/Plan This is a 50 year old male admitted for hypoglycemia. 1. Hypoglycemia: Hold long-acting insulin for now; continue D5 half normal saline. Monitor blood sugars. 2. Diabetes mellitus type 1: Restart basal insulin tonight. Continue sliding scale insulin. 3. Seizure disorder: Continue Keppra 4. Progressive cognitive decline: Continue Aricept 5. Mood disorder: Associated with developmental delay; continue Seroquel and Valium (also used for contractures) 6. Hyperlipidemia: Continue statin therapy 7. Urinary retention: Continue tamsulosin 8. DVT prophylaxis: Lovenox 9. GI prophylaxis: Pepcid The patient is a DO NOT RESUSCITATE. Time spent on admission orders and patient care approximately 45 minutes  Harrie Foreman, MD 01/05/2016, 6:38 AM

## 2016-01-06 LAB — BASIC METABOLIC PANEL
Anion gap: 5 (ref 5–15)
BUN: 10 mg/dL (ref 6–20)
CHLORIDE: 104 mmol/L (ref 101–111)
CO2: 27 mmol/L (ref 22–32)
CREATININE: 0.63 mg/dL (ref 0.61–1.24)
Calcium: 8.4 mg/dL — ABNORMAL LOW (ref 8.9–10.3)
GFR calc Af Amer: >60 mL/min (ref >60)
GFR calc non Af Amer: >60 mL/min (ref >60)
GLUCOSE: 251 mg/dL — AB (ref 65–99)
Potassium: 4.1 mmol/L (ref 3.5–5.1)
SODIUM: 136 mmol/L (ref 135–145)

## 2016-01-06 LAB — GLUCOSE, CAPILLARY
Glucose-Capillary: 283 mg/dL — ABNORMAL HIGH (ref 65–99)
Glucose-Capillary: 200 mg/dL — ABNORMAL HIGH (ref 65–99)

## 2016-01-06 MED ORDER — INSULIN ASPART 100 UNIT/ML ~~LOC~~ SOLN: 2.0000 [IU] | Freq: Three times a day (TID) | SUBCUTANEOUS | 11 refills | Status: AC

## 2016-01-06 MED ORDER — INSULIN GLARGINE 100 UNIT/ML ~~LOC~~ SOLN: 10.0000 [IU] | Freq: Every day | SUBCUTANEOUS | 11 refills | Status: AC

## 2016-01-06 NOTE — NC FL2 (Signed)
[place] MEDICAID FL2 LEVEL OF CARE SCREENING TOOL     IDENTIFICATION  Patient Name: Martin Randolph Birthdate: [birth year] Sex: male Admission Date (Current Location): 01/04/2016  [facility] [place]   Facility and Address:  [facility], [address], [place], [place] [address]      Provider Number: [id number]  Attending Physician Name and Address:  Auburn BilberryShreyang Patel, MD  Relative Name and Phone Number:       Current Level of Care: Hospital Recommended Level of Care: Skilled Nursing Facility Prior Approval Number:    Date Approved/Denied:   PASRR Number:    Discharge Plan: SNF    Current Diagnoses: Patient Active Problem List   Diagnosis Date Noted  . Protein-calorie malnutrition, severe 11/16/2015  . Hyperglycemia   . Hypotension   . Bradycardia   . Nausea and vomiting   . Diabetic ketoacidosis without coma associated with type 1 diabetes mellitus (HCC) 11/15/2015  . Pressure injury of skin 11/15/2015  . Seizure (HCC)   . Hypoglycemia 10/29/2015  . Diabetes (HCC) 10/29/2015  . Down syndrome 10/29/2015    Orientation RESPIRATION BLADDER Height & Weight     Self    Incontinent Weight: 103 lb (46.7 kg) Height:  5\' 3"  (160 cm)  BEHAVIORAL SYMPTOMS/MOOD NEUROLOGICAL BOWEL NUTRITION STATUS      Incontinent    AMBULATORY STATUS COMMUNICATION OF NEEDS Skin   Total Care Non-Verbally Normal                       Personal Care Assistance Level of Assistance  Bathing, Feeding, Dressing Bathing Assistance: Maximum assistance Feeding assistance: Limited assistance Dressing Assistance: Maximum assistance     Functional Limitations Info             SPECIAL CARE FACTORS FREQUENCY                       Contractures Contractures Info: Present    Additional Factors Info  Code Status Code Status Info: DNR             Current Medications (01/06/2016):  This is the current hospital active medication  list Current Facility-Administered Medications  Medication Dose Route Frequency Provider Last Rate Last Dose  . 0.9 %  sodium chloride infusion   Intravenous Continuous Auburn BilberryShreyang Patel, MD 100 mL/hr at 01/06/16 0824    . acetaminophen (TYLENOL) tablet 650 mg  650 mg Oral Q6H PRN Arnaldo NatalMichael S Diamond, MD       Or  . acetaminophen (TYLENOL) suppository 650 mg  650 mg Rectal Q6H PRN Arnaldo NatalMichael S Diamond, MD      . albuterol (PROVENTIL) (2.5 MG/3ML) 0.083% nebulizer solution 2.5 mg  2.5 mg Nebulization Q4H PRN Arnaldo NatalMichael S Diamond, MD      . carbamide peroxide (DEBROX) 6.5 % otic solution 5-10 drop  5-10 drop Both Ears BID Arnaldo NatalMichael S Diamond, MD   5 drop at 01/06/16 0931  . dextrose (GLUTOSE) 40 % oral gel 37.5 g  1 Tube Oral Once PRN Arnaldo NatalMichael S Diamond, MD      . diazepam (VALIUM) tablet 2 mg  2 mg Oral QHS Arnaldo NatalMichael S Diamond, MD   2 mg at 01/05/16 2115  . docusate sodium (COLACE) capsule 100 mg  100 mg Oral BID Arnaldo NatalMichael S Diamond, MD   100 mg at 01/06/16 0930  . donepezil (ARICEPT) tablet 10 mg  10 mg Oral QHS Arnaldo NatalMichael S Diamond, MD  10 mg at 01/05/16 2115  . enoxaparin (LOVENOX) injection 40 mg  40 mg Subcutaneous Q24H Arnaldo NatalMichael S Diamond, MD   40 mg at 01/05/16 2114  . famotidine (PEPCID) tablet 20 mg  20 mg Oral Daily Arnaldo NatalMichael S Diamond, MD   20 mg at 01/06/16 0930  . feeding supplement (GLUCERNA SHAKE) (GLUCERNA SHAKE) liquid 237 mL  237 mL Oral TID BM Arnaldo NatalMichael S Diamond, MD   237 mL at 01/05/16 2000  . gabapentin (NEURONTIN) capsule 400 mg  400 mg Oral QHS Arnaldo NatalMichael S Diamond, MD   400 mg at 01/05/16 2115  . hydrocerin (EUCERIN) cream   Topical BID Arnaldo NatalMichael S Diamond, MD      . insulin aspart (novoLOG) injection 0-9 Units  0-9 Units Subcutaneous TID [facility] Arnaldo NatalMichael S Diamond, MD   5 Units at 01/06/16 [phone]  . insulin glargine (LANTUS) injection 4 Units  4 Units Subcutaneous QHS Auburn BilberryShreyang Patel, MD   4 Units at 01/05/16 2123  . levETIRAcetam (KEPPRA) tablet 500 mg  500 mg Oral BID Arnaldo NatalMichael S Diamond, MD   500 mg at  01/06/16 0930  . Melatonin TABS 2.5 mg  2.5 mg Oral QHS Arnaldo NatalMichael S Diamond, MD      . multivitamin liquid 15 mL  15 mL Oral Daily Arnaldo NatalMichael S Diamond, MD   15 mL at 01/06/16 0931  . ondansetron (ZOFRAN) tablet 4 mg  4 mg Oral Q6H PRN Arnaldo NatalMichael S Diamond, MD       Or  . ondansetron [facility](ZOFRAN) injection 4 mg  4 mg Intravenous Q6H PRN Arnaldo NatalMichael S Diamond, MD      . polyethylene glycol (MIRALAX / GLYCOLAX) packet 17 g  17 g Oral Q3 days Arnaldo NatalMichael S Diamond, MD   17 g at 01/05/16 1017  . QUEtiapine (SEROQUEL) tablet 100 mg  100 mg Oral BID Arnaldo NatalMichael S Diamond, MD   100 mg at 01/06/16 0930  . QUEtiapine (SEROQUEL) tablet 25 mg  25 mg Oral QHS Arnaldo NatalMichael S Diamond, MD   25 mg at 01/05/16 2116  . simvastatin (ZOCOR) tablet 10 mg  10 mg Oral QHS Arnaldo NatalMichael S Diamond, MD   10 mg at 01/05/16 2115  . tamsulosin (FLOMAX) capsule 0.4 mg  0.4 mg Oral QHS Arnaldo NatalMichael S Diamond, MD   0.4 mg at 01/05/16 2114     Discharge Medications: Please see discharge summary for a list of discharge medications.  Relevant Imaging Results:  Relevant Lab Results:   Additional Information    Judi CongKaren M Caty Tessler, LCSW

## 2016-01-06 NOTE — Clinical Social Work Note (Signed)
CSW has confirmed with the facility and the patient's legal guardian that he will return [facility] via EMS transport. CSW will deliver packet once the dc summary and the FL 2 are signed.  Argentina PonderKaren Martha Elenora Hawbaker, MSW, LCSW-A [phone]

## 2016-01-06 NOTE — Discharge Instructions (Addendum)
[facility] - [place] at [facility]  DIET:  Diabetic diet  DISCHARGE CONDITION:  Stable  ACTIVITY:  Activity as tolerated  OXYGEN:  Home Oxygen: No.   Oxygen Delivery: room air  DISCHARGE LOCATION:  nursing home    ADDITIONAL DISCHARGE INSTRUCTION: please make sure assistane provided to patient with all meals to help him eat, Also make sure he drinks plenty of fluids  Pt needs 3 meals + 2 snacks on daily basis    If you experience worsening of your admission symptoms, develop shortness of breath, life threatening emergency, suicidal or homicidal thoughts you must seek medical attention immediately by calling 911 or calling your MD immediately  if symptoms less severe.  You Must read complete instructions/literature along with all the possible adverse reactions/side effects for all the Medicines you take and that have been prescribed to you. Take any new Medicines after you have completely understood and accpet all the possible adverse reactions/side effects.   Please note  You were cared for by a hospitalist during your hospital stay. If you have any questions about your discharge medications or the care you received while you were in the hospital after you are discharged, you can call the unit and asked to speak with the hospitalist on call if the hospitalist that took care of you is not available. Once you are discharged, your primary care physician will handle any further medical issues. Please note that NO REFILLS for any discharge medications will be authorized once you are discharged, as it is imperative that you return to your primary care physician (or establish a relationship with a primary care physician if you do not have one) for your aftercare needs so that they can reassess your need for medications and monitor your lab values.

## 2016-01-06 NOTE — Progress Notes (Signed)
Held melatonin due to drowsiness during day shift; start of shift. [name], [name]

## 2016-01-06 NOTE — Discharge Summary (Signed)
[facility] - [place] at [facility], 50 y.o., DOB [birth year], MRN [id number]. Admission date: 01/04/2016 Discharge Date 01/06/2016 Primary MD Pasty Spillers McLean-Scocuzza, MD Admitting Physician Arnaldo Natal, MD  Admission Diagnosis  Hypoglycemia [E16.2]  Discharge Diagnosis   Active Problems:   Hypoglycemia   Pressure injury of skin  Diabetes type 1 Alzheimer's dementia Down syndrome Hyperlipidemia Hypothyroidism Seizures       Hospital CourseThe patient with past medical history of Down syndrome and diabetes presents to the emergency department after a fall. The patient was found in the floor at his long-term care facility. Initial assessment revealed hypoglycemia. Patient has had admissions for dehydration recently as well. Patient was started on D5 and received amp of D50. His insulin was held. Subsequently he was noted to have blood sugars that were elevated. He was restarted on his home regimen. It is recommended that patient be assisted with all his meals and receives snacks in between his meals. His insulin dose is decreased. Patient currently at baseline.             Consults  None  Significant Tests:  See full reports for all details     Ct Head Wo Contrast  Result Date: 01/05/2016 CLINICAL DATA:  Rolled out of bed hematoma to right forehead EXAM: CT HEAD WITHOUT CONTRAST TECHNIQUE: Contiguous axial images were obtained from the base of the skull through the vertex without intravenous contrast. COMPARISON:  10/31/2015 FINDINGS: Brain: No acute territorial infarction or intracranial hemorrhage is visualized. There is no focal mass, mass effect or midline shift. 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 Moderate atrophy. Enlarged ventricles are similar in configuration. Subcortical hypodensity within the bifrontal white matter again noted. Tiny bubbles of air within the anterior venous sinus, likely iatrogenic. Vascular:  No hyperdense vessel or unexpected calcification. Skull: Mastoid air cells are clear.  No skull fracture. Sinuses/Orbits: No acute finding. Other: Small to moderate right forehead soft tissue hematoma. IMPRESSION: 1. No definite CT evidence for acute intracranial abnormality. 2. Atrophy. Low attenuation within the bilateral frontal lobe white matter, unchanged, possibly posttraumatic. 3. Small right forehead hematoma. Electronically Signed   By: Jasmine Pang M.D.   On: 01/05/2016 00:11   Dg Chest Portable 1 View  Result Date: 12/31/2015 CLINICAL DATA:  Seizure EXAM: PORTABLE CHEST 1 VIEW COMPARISON:  11/15/2015 FINDINGS: The heart size and mediastinal contours are within normal limits. Both lungs are clear. The visualized skeletal structures are unremarkable. IMPRESSION: No active disease. Electronically Signed   By: Marlan Palau M.D.   On: 12/31/2015 14:38       Today   Subjective:   Martin Randolph  unable to provide any review of systems no overnight events  Objective:   Blood pressure (!) 141/85, pulse 92, temperature 97.5 F (36.4 C), temperature source Oral, resp. rate 18, height 5\' 3"  (1.6 m), weight 103 lb (46.7 kg), SpO2 100 %.  .  Intake/Output Summary (Last 24 hours) at 01/06/16 1220 Last data filed at 01/05/16 2300  Gross per 24 hour  Intake              400 ml  Output                0 ml  Net              400 ml    Exam VITAL SIGNS: Blood pressure (!) 141/85, pulse 92, temperature 97.5 F (36.4 C), temperature source Oral, resp.  rate 18, height 5\' 3"  (1.6 m), weight 103 lb (46.7 kg), SpO2 100 %.  GENERAL:  50 y.o.-year-old patient lying in the bed with no acute distress.  EYES: Pupils equal, round, reactive to light and accommodation. No scleral icterus. Extraocular muscles intact.  HEENT: Head atraumatic, normocephalic. Oropharynx and nasopharynx clear.  NECK:  Supple, no jugular venous distention. No thyroid enlargement, no tenderness.  LUNGS: Normal breath sounds  bilaterally, no wheezing, rales,rhonchi or crepitation. No use of accessory muscles of respiration.  CARDIOVASCULAR: S1, S2 normal. No murmurs, rubs, or gallops.  ABDOMEN: Soft, nontender, nondistended. Bowel sounds present. No organomegaly or mass.  EXTREMITIES: No pedal edema, cyanosis, or clubbing.  NEUROLOGIC: Cranial nerves II through XII are intact. Muscle strength 5/5 in all extremities. Sensation intact. Gait not checked.  PSYCHIATRIC: The patient is alert and oriented x 3.  SKIN: No obvious rash, lesion, or ulcer.   Data Review     CBC w Diff: Lab Results  Component Value Date   WBC 10.7 (H) 01/04/2016   HGB 13.2 01/04/2016   HCT 39.5 (L) 01/04/2016   PLT 366 01/04/2016   LYMPHOPCT 12 12/31/2015   MONOPCT 8 12/31/2015   EOSPCT 0 12/31/2015   BASOPCT 1 12/31/2015   CMP: Lab Results  Component Value Date   NA 136 01/06/2016   K 4.1 01/06/2016   CL 104 01/06/2016   CO2 27 01/06/2016   BUN 10 01/06/2016   CREATININE 0.63 01/06/2016   PROT 6.1 (L) 01/04/2016   ALBUMIN 3.0 (L) 01/04/2016   BILITOT 0.6 01/04/2016   ALKPHOS 82 01/04/2016   AST 22 01/04/2016   ALT 18 01/04/2016  .  Micro Results No results found for this or any previous visit (from the past 240 hour(s)).      Code Status Orders        Start     Ordered   01/05/16 0415  Do not attempt resuscitation (DNR)  Continuous    Question Answer Comment  In the event of cardiac or respiratory ARREST Do not call a "code blue"   In the event of cardiac or respiratory ARREST Do not perform Intubation, CPR, defibrillation or ACLS   In the event of cardiac or respiratory ARREST Use medication by any route, position, wound care, and other measures to relive pain and suffering. May use oxygen, suction and manual treatment of airway obstruction as needed for comfort.      01/05/16 0414    Code Status History    Date Active Date Inactive Code Status Order ID Comments User Context   11/15/2015  1:54 PM  11/20/2015  9:12 PM DNR [id number]  Milagros LollSrikar Sudini, MD ED   11/15/2015  1:09 PM 11/15/2015  1:54 PM Full Code [id number]  Milagros LollSrikar Sudini, MD ED   10/29/2015 11:07 PM 11/03/2015  5:04 PM Full Code [id number]  Oralia Manisavid Willis, MD ED    Advance Directive Documentation   Flowsheet Row Most Recent Value  Type of Advance Directive  Out of facility DNR (pink MOST or yellow form)  Pre-existing out of facility DNR order (yellow form or pink MOST form)  Pink MOST form placed in chart (order not valid for inpatient use)  "MOST" Form in Place?  No data          Follow-up Information    Pasty Spillersracy N McLean-Scocuzza, MD Follow up in 5 day(s).   Specialty:  Internal Medicine Contact information: [address] [place] [address] [phone]  Discharge Medications     Medication List    TAKE these medications   acetaminophen 325 MG tablet Commonly known as:  TYLENOL Take 650 mg by mouth every 6 (six) hours as needed for mild pain.   albuterol (2.5 MG/3ML) 0.083% nebulizer solution Commonly known as:  PROVENTIL Take 3 mLs (2.5 mg total) by nebulization every 6 (six) hours as needed for wheezing.   carbamide peroxide 6.5 % otic solution Commonly known as:  DEBROX Place 5-10 drops into both ears 2 (two) times daily.   cetaphil lotion Apply 1 application topically 2 (two) times daily.   dextrose 40 % Gel Commonly known as:  GLUTOSE Take 37.5 g by mouth once as needed for low blood sugar.   diazepam 2 MG tablet Commonly known as:  VALIUM Take 1 tablet (2 mg total) by mouth at bedtime.   donepezil 10 MG tablet Commonly known as:  ARICEPT Take 10 mg by mouth at bedtime.   famotidine 20 MG tablet Commonly known as:  PEPCID Take 20 mg by mouth daily.   feeding supplement (GLUCERNA SHAKE) Liqd Take 237 mLs by mouth 3 (three) times daily between meals.   gabapentin 400 MG capsule Commonly known as:  NEURONTIN Take 400 mg by mouth at bedtime.   insulin aspart 100 UNIT/ML  injection Commonly known as:  novoLOG Inject 2 Units into the skin 3 (three) times daily with meals. What changed:  how much to take   insulin glargine 100 UNIT/ML injection Commonly known as:  LANTUS Inject 0.1 mLs (10 Units total) into the skin at bedtime. What changed:  how much to take   levETIRAcetam 500 MG tablet Commonly known as:  KEPPRA Take 1 tablet (500 mg total) by mouth 2 (two) times daily.   Melatonin 3 MG Tabs Take 3 mg by mouth at bedtime.   multivitamin Liqd Take 15 mLs by mouth daily.   polyethylene glycol packet Commonly known as:  MIRALAX / GLYCOLAX Take 17 g by mouth every 3 (three) days.   QUEtiapine 25 MG tablet Commonly known as:  SEROQUEL Take 25 mg by mouth at bedtime.   QUEtiapine 50 MG tablet Commonly known as:  SEROQUEL Take 2 tablets (100 mg total) by mouth 2 (two) times daily.   simvastatin 10 MG tablet Commonly known as:  ZOCOR Take 10 mg by mouth at bedtime.   tamsulosin 0.4 MG Caps capsule Commonly known as:  FLOMAX Take 0.4 mg by mouth at bedtime.          Total Time in preparing paper work, data evaluation and todays exam - 35 minutes  Auburn BilberryPATEL, Brenen Beigel M.D on 01/06/2016 at 12:21 PM  [facility]   Office  [phone]

## 2016-01-06 NOTE — Clinical Social Work Note (Signed)
Clinical Social Work Assessment  Patient Details  Name: Martin Randolph MRN: [id number] Date of Birth: 03/04/1965  Date of referral:  01/06/16               Reason for consult:  Facility Placement                Permission sought to share information with:  Facility Industrial/product designerContact Representative Permission granted to share information::  Yes, Verbal Permission Granted  Name::        Agency::     Relationship::     Contact Information:     Housing/Transportation Living arrangements for the past 2 months:  Skilled Building surveyorursing Facility Source of Information:  Facility, Guardian Patient Interpreter Needed:  None Criminal Activity/Legal Involvement Pertinent to Current Situation/Hospitalization:  No - Comment as needed Significant Relationships:  Other Family Members, Siblings, Parents Lives with:  Facility Resident Do you feel safe going back to the place where you live?  Yes Need for family participation in patient care:  Yes (Comment)  Care giving concerns:  [facility]   Social Worker assessment / plan:  CSW contacted [facility] [place] [facility] to arrange [facility] plans. [facility] admissions administrator Martin Randolph confirmed that the patient can return. CSW attempted to contact the patient's guardian Martin Randolph and her phone was disconnected. CSW attempted to contact the patient's HCPOA and left voicemail.   Employment status:  Retired Health and safety inspectornsurance information:  Medicare PT Recommendations:  Not assessed at this time Information / Referral to community resources:     Patient/Family's Response to care:  Patient is non-verbal.  Patient/Family's Understanding of and Emotional Response to Diagnosis, Current Treatment, and Prognosis:  Facility is aware of medical clearance.  Emotional Assessment Appearance:  Appears stated age Attitude/Demeanor/Rapport:   (Patient is non-verbal) Affect (typically observed):   (Patient is non-verbal) Orientation:   (Patient is  non-verbal) Alcohol / Substance use:  Never Used Psych involvement (Current and /or in the community):  No (Comment)  Discharge Needs  Concerns to be addressed:  Discharge Planning Concerns Readmission within the last 30 days:  Yes Current discharge risk:  Chronically ill, Cognitively Impaired Barriers to Discharge:  No Barriers Identified   Martin CongKaren M Eann Cleland, LCSW 01/06/2016, 11:02 AM

## 2016-01-06 NOTE — Progress Notes (Signed)
Pt being [facility] via [facility]. Writer called [facility] to [facility]. Pt being discharged on room air, no distress noted. IV removed. Otilio JeffersonMadelyn S Fenton, RN

## 2016-03-07 ENCOUNTER — Encounter: Payer: Self-pay | Admitting: Emergency Medicine

## 2016-03-07 ENCOUNTER — Emergency Department: Payer: Medicare Other

## 2016-03-07 ENCOUNTER — Emergency Department
Admission: EM | Admit: 2016-03-07 | Discharge: 2016-03-07 | Disposition: A | Discharge status: Home / Self Care | Payer: Medicare Other | Attending: Emergency Medicine | Admitting: Emergency Medicine

## 2016-03-07 DIAGNOSIS — R509 Fever, unspecified: Secondary | ICD-10-CM | POA: Diagnosis present

## 2016-03-07 DIAGNOSIS — G309 Alzheimer's disease, unspecified: Secondary | ICD-10-CM | POA: Diagnosis not present

## 2016-03-07 DIAGNOSIS — J181 Lobar pneumonia, unspecified organism: Secondary | ICD-10-CM | POA: Diagnosis not present

## 2016-03-07 DIAGNOSIS — E039 Hypothyroidism, unspecified: Secondary | ICD-10-CM | POA: Insufficient documentation

## 2016-03-07 DIAGNOSIS — Z794 Long term (current) use of insulin: Secondary | ICD-10-CM | POA: Insufficient documentation

## 2016-03-07 DIAGNOSIS — E119 Type 2 diabetes mellitus without complications: Secondary | ICD-10-CM | POA: Insufficient documentation

## 2016-03-07 DIAGNOSIS — Z79899 Other long term (current) drug therapy: Secondary | ICD-10-CM | POA: Insufficient documentation

## 2016-03-07 DIAGNOSIS — J189 Pneumonia, unspecified organism: Secondary | ICD-10-CM

## 2016-03-07 LAB — COMPREHENSIVE METABOLIC PANEL
ALBUMIN: 2.7 g/dL — AB (ref 3.5–5.0)
ALT: 28 U/L (ref 17–63)
ANION GAP: 8 (ref 5–15)
AST: 29 U/L (ref 15–41)
Alkaline Phosphatase: 69 U/L (ref 38–126)
BILIRUBIN TOTAL: 0.5 mg/dL (ref 0.3–1.2)
BUN: 24 mg/dL — AB (ref 6–20)
CHLORIDE: 108 mmol/L (ref 101–111)
CO2: 27 mmol/L (ref 22–32)
Calcium: 8.1 mg/dL — ABNORMAL LOW (ref 8.9–10.3)
Creatinine, Ser: 0.83 mg/dL (ref 0.61–1.24)
GFR calc Af Amer: >60 mL/min (ref >60)
GFR calc non Af Amer: >60 mL/min (ref >60)
GLUCOSE: 289 mg/dL — AB (ref 65–99)
POTASSIUM: 4.2 mmol/L (ref 3.5–5.1)
Sodium: 143 mmol/L (ref 135–145)
TOTAL PROTEIN: 6.6 g/dL (ref 6.5–8.1)

## 2016-03-07 LAB — CBC WITH DIFFERENTIAL/PLATELET
BASOS ABS: 0 10*3/uL (ref 0–0.1)
BASOS PCT: 0 %
EOS ABS: 0 10*3/uL (ref 0–0.7)
EOS PCT: 0 %
HCT: 37 % — ABNORMAL LOW (ref 40.0–52.0)
Hemoglobin: 12.1 g/dL — ABNORMAL LOW (ref 13.0–18.0)
Lymphocytes Relative: 7 %
Lymphs Abs: 1.2 10*3/uL (ref 1.0–3.6)
MCH: 32.8 pg (ref 26.0–34.0)
MCHC: 32.6 g/dL (ref 32.0–36.0)
MCV: 100.5 fL — ABNORMAL HIGH (ref 80.0–100.0)
MONO ABS: 1.3 10*3/uL — AB (ref 0.2–1.0)
MONOS PCT: 8 %
NEUTROS ABS: 13.7 10*3/uL — AB (ref 1.4–6.5)
Neutrophils Relative %: 85 %
PLATELETS: 228 10*3/uL (ref 150–440)
RBC: 3.68 MIL/uL — ABNORMAL LOW (ref 4.40–5.90)
RDW: 14.8 % — AB (ref 11.5–14.5)
WBC: 16.2 10*3/uL — ABNORMAL HIGH (ref 3.8–10.6)

## 2016-03-07 MED ORDER — AMOXICILLIN-POT CLAVULANATE 875-125 MG PO TABS: 1.0000 | ORAL_TABLET | Freq: Two times a day (BID) | ORAL | 0 refills | Status: AC

## 2016-03-07 MED ORDER — DEXTROSE 5 % IV SOLN
1.0000 g | Freq: Once | INTRAVENOUS | Status: DC
  Filled 2016-03-07: qty 10

## 2016-03-07 MED ORDER — SODIUM CHLORIDE 0.9 % IV BOLUS (SEPSIS)
500.0000 mL | Freq: Once | INTRAVENOUS | Status: AC
  Administered 2016-03-07: 500 mL via INTRAVENOUS

## 2016-03-07 MED ORDER — CEFTRIAXONE SODIUM-DEXTROSE 1-3.74 GM-% IV SOLR
INTRAVENOUS | Status: AC
  Administered 2016-03-07: 1000 mg via INTRAVENOUS
  Filled 2016-03-07: qty 50

## 2016-03-07 MED ORDER — CEFTRIAXONE SODIUM-DEXTROSE 1-3.74 GM-% IV SOLR
1.0000 g | Freq: Once | INTRAVENOUS | Status: AC
  Administered 2016-03-07: 1000 mg via INTRAVENOUS

## 2016-03-07 NOTE — ED Notes (Signed)
Ed Diplomatic Services operational officersecretary aware that pt will need transport back to the [facility].

## 2016-03-07 NOTE — Discharge Instructions (Signed)
Please continue with physical therapy at the facility and tried to work the patient to transition. The patient still has some mild residual right lower lobe pneumonia and we will establish on oral antibiotics at this time. Please continue all her other current medications. Please use Tylenol and/or ibuprofen in the event of a fever.

## 2016-03-07 NOTE — ED Triage Notes (Addendum)
Pt to ed [facility]s [facility]. Pt with severe MR and is unable to communicate. Per ems, pt [facility]. Family requests pt to be transported to this facility for second opinion. NAD, VSS. Family not present at this time. Pt trying to pull at cords and tries to scoot himself out the end of the stretcher.  Safety sitter requested and is at bedside.

## 2016-03-07 NOTE — ED Notes (Signed)
Unable to contact family

## 2016-03-07 NOTE — ED Provider Notes (Signed)
Time Seen: Approximately 1132  I have reviewed the triage notes  Chief Complaint: Other   History of Present Illness: Martin Randolph is a 51 y.o. male who presents from the [facility]. The patient has severe mental retardation and unable to communicate. According to EMS and the family and the patient was admitted to [facility] and was treated for pneumonia. The patient still is having some intermittent fevers after transfer back to the [facility]. Review his records shows he is not currently on any antibiotic therapy. The family was concerned because the patient usually is ambulatory and has not been ambulating since his discharge. No focal weakness is been identified and apparently physical therapy has been consult today but is not seen the patient at this time. The brother states that he's had a decreased appetite over the last couple of days. He was concerned he may have some electrolyte abnormalities would be dehydrated.   Past Medical History:  Diagnosis Date  . Alzheimer's dementia   . Diabetes (HCC)   . Down syndrome   . Hyperlipidemia   . Hypothyroidism   . Nonverbal    a. Since age [age] following fall with skull fracture.  . Seizures [facility])     Patient Active Problem List   Diagnosis Date Noted  . Protein-calorie malnutrition, severe 11/16/2015  . Hyperglycemia   . Hypotension   . Bradycardia   . Nausea and vomiting   . Diabetic ketoacidosis without coma associated with type 1 diabetes mellitus (HCC) 11/15/2015  . Pressure injury of skin 11/15/2015  . Seizure (HCC)   . Hypoglycemia 10/29/2015  . Diabetes (HCC) 10/29/2015  . Down syndrome 10/29/2015    Past Surgical History:  Procedure Laterality Date  . DENTAL SURGERY      Past Surgical History:  Procedure Laterality Date  . DENTAL SURGERY      Current Outpatient Rx  . Order #: [id number] Class: Historical Med  . Order #: [id number] Class: Normal  . Order #: [id number] Class: Historical Med   . Order #: [id number] Class: Historical Med  . Order #: [id number] Class: Historical Med  . Order #: [id number] Class: Historical Med  . Order #: [id number] Class: Historical Med  . Order #: [id number] Class: Historical Med  . Order #: [id number] Class: Normal  . Order #: [id number] Class: Normal  . Order #: [id number] Class: Normal  . Order #: [id number] Class: Historical Med  . Order #: [id number] Class: Historical Med  . Order #: [id number] Class: Historical Med  . Order #: [id number] Class: Historical Med  . Order #: [id number] Class: Historical Med  . Order #: [id number] Class: Historical Med  . Order #: [id number] Class: Print  . Order #: [id number] Class: Normal  . Order #: [id number] Class: Print  . Order #: [id number] Class: Historical Med  . Order #: [id number] Class: Historical Med  . Order #: [id number] Class: Historical Med  . Order #: [id number] Class: Normal  . Order #: [id number] Class: Historical Med  . Order #: [id number] Class: Historical Med  . Order #: [id number] Class: Normal    Allergies:  Patient has no known allergies.  Family History: No family history on file.  Social History: Social History  Substance Use Topics  . Smoking status: Never Smoker  . Smokeless tobacco: Never Used  . Alcohol use No     Review of Systems:   10 point review of systems was performed and was otherwise negative: Review of systems is acquired through the family, medical record, and EMS Constitutional: Low-grade intermittent fevers Eyes: No visual disturbances ENT: No sore throat, ear pain Cardiac: No chest pain  Respiratory: Mild cough No shortness of breath, wheezing, or stridor Abdomen: No abdominal pain, no vomiting, No diarrhea Endocrine: No weight loss, No night sweats Extremities: No peripheral edema, cyanosis Skin: No rashes, easy bruising Neurologic: No focal weakness, trouble with speech or swollowing Urologic: No dysuria, Hematuria, or urinary frequency   Physical Exam:  ED Triage Vitals  Enc  Vitals Group     BP 03/07/16 1103 99/65     Pulse Rate 03/07/16 1103 96     Resp 03/07/16 1103 20     Temp 03/07/16 1103 98.1 F (36.7 C)     Temp Source 03/07/16 1103 Oral     SpO2 03/07/16 1103 100 %     Weight 03/07/16 1104 98 lb 8 oz (44.7 kg)     Height --      Head Circumference --      Peak Flow --      Pain Score --      Pain Loc --      Pain Edu? --      Excl. in GC? --     General: Awake , Alert , aPatient is nonverbal;  Head: Normal cephalic , atraumatic Eyes: Pupils equal , round, reactive to light Nose/Throat: No nasal drainage, patent upper airway without erythema or exudate. Moist mucous membranes Neck: Supple, Full range of motion, No anterior adenopathy or palpable thyroid masses Lungs: Clear to ascultation without wheezes , rhonchi, or rales Heart: Regular rate, regular rhythm without murmurs , gallops , or rubs Abdomen: Soft, non tender without rebound, guarding , or rigidity; bowel sounds positive and symmetric in all 4 quadrants. No organomegaly .        Extremities: 2 plus symmetric pulses. No edema, clubbing or cyanosis Neurologic:  Motor symmetric without deficits, sensory intact. He has spontaneous movement of both lower extremities Skin: warm, dry, no rashes   Labs:   All laboratory work was reviewed including any pertinent negatives or positives listed below:  Labs Reviewed  CBC WITH DIFFERENTIAL/PLATELET - Abnormal; Notable for the following:       Result Value   WBC 16.2 (*)    RBC 3.68 (*)    Hemoglobin 12.1 (*)    HCT 37.0 (*)    MCV 100.5 (*)    RDW 14.8 (*)    Neutro Abs 13.7 (*)    Monocytes Absolute 1.3 (*)    All other components within normal limits  COMPREHENSIVE METABOLIC PANEL - Abnormal; Notable for the following:    Glucose, Bld 289 (*)    BUN 24 (*)    Calcium 8.1 (*)    Albumin 2.7 (*)    All other components within normal limits  Laboratory work shows an elevated white blood cell count but otherwise lab work overall is  within normal limits  Radiology:  "Dg Chest Portable 1 View  Result Date: 03/07/2016 CLINICAL DATA:  Down syndrome common EXAM: PORTABLE CHEST 1 VIEW COMPARISON:  12/31/2015 FINDINGS: Patchy right lower lobe opacity, suspicious for pneumonia. Left lung is clear. No pleural effusion or pneumothorax. The heart is normal in size. IMPRESSION: Patchy right lower lobe opacity, suspicious for pneumonia. Electronically Signed   By: Charline Bills M.D.   On: 03/07/2016 12:22  "  I personally reviewed the radiologic studies     ED Course: * Patient was given IV fluid bolus and had chest x-ray which shows likely some residual pneumonia that I don't have any comparison films. His white blood cell count is  elevated. He is not hypoxic and does not appear to be in respiratory distress. Patient was given IV Rocephin here in emergency department felt could be treated for what appears to be residual pneumonia with oral antibiotic therapy. The electrolytes appear to be within normal limits and the patient was able to eat and drink here with his brother at the bedside and the brother felt like he was more back to his baseline. He was comfortable and transported back to the facility is reviewed all the findings of today's visit to the emergency department.     Assessment: * Resolving community-acquired pneumonia  Final Clinical Impression: *  Final diagnoses:  Community acquired pneumonia of right lower lobe of lung (HCC)     Plan: Outpatient " New Prescriptions   AMOXICILLIN-CLAVULANATE (AUGMENTIN) 875-125 MG TABLET    Take 1 tablet by mouth 2 (two) times daily.  "  Patient was advised to return immediately if condition worsens. Patient was advised to follow up with their primary care physician or other specialized physicians involved in their outpatient care. The patient and/or family member/power of attorney had laboratory results reviewed at the bedside. All questions and concerns were addressed  and appropriate discharge instructions were distributed by the nursing staff.             Jennye MoccasinBrian S Valerio Pinard, MD 03/07/16 1520

## 2016-03-07 NOTE — ED Notes (Signed)
[facility] to find out about [facility] needs. 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 Pt is diabetic and does use insulin per  [facility]ursing staff [facility]. Sitter at bedside.

## 2016-03-07 NOTE — ED Notes (Signed)
Awaiting [facility].

## 2017-05-10 IMAGING — CT CT HEAD W/O CM
5 of 8 series · 14 of 47 positions shown, 15 images · non-contrast
Comparison: 10/31/2015

CLINICAL DATA: Rolled out of bed hematoma to right forehead

EXAM:
CT HEAD WITHOUT CONTRAST
TECHNIQUE: Contiguous axial images were obtained from the base of the skull
through the vertex without intravenous contrast.

[Series 2: head wo · axial · 0.40mm/px · z∈[-185,-135]mm · 2 of 32 slices shown, 3 images]
[im 11/32  brain]
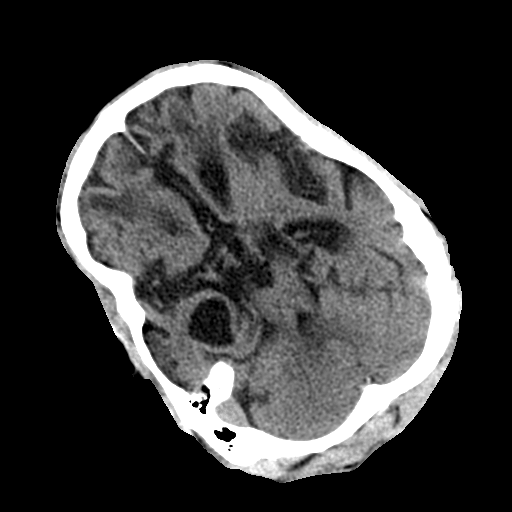
[im 11/32  bone]
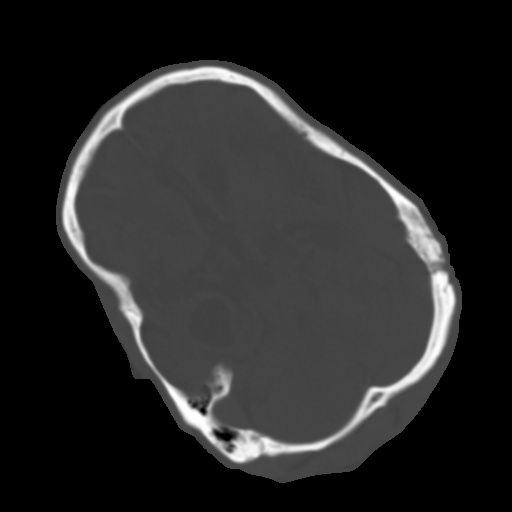
[im 21/32  brain]
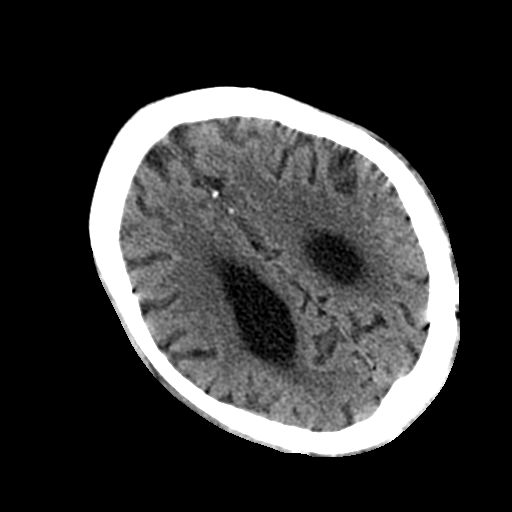

[Series 4: ax head wo recon · axial · 0.40mm/px · z∈[-155,-111]mm · 2 of 29 slices shown]
[im 10/29  brain]
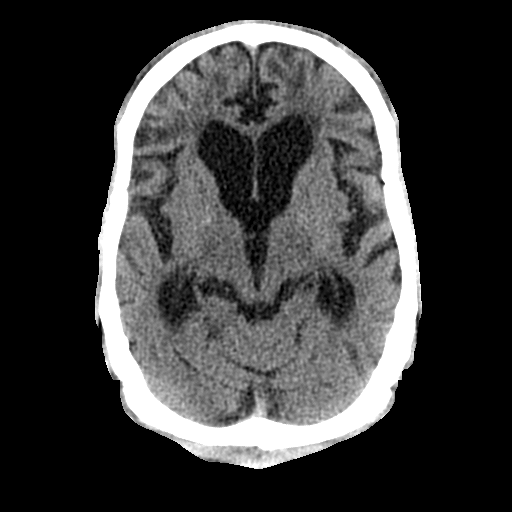
[im 19/29  brain]
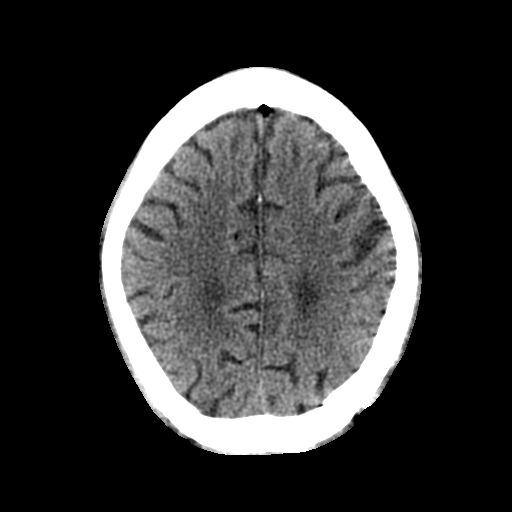

[Series 5: ax head bone recon · axial · 0.32mm/px · z∈[-192,-147]mm · 4 of 70 slices shown]
[im 8/70  bone]
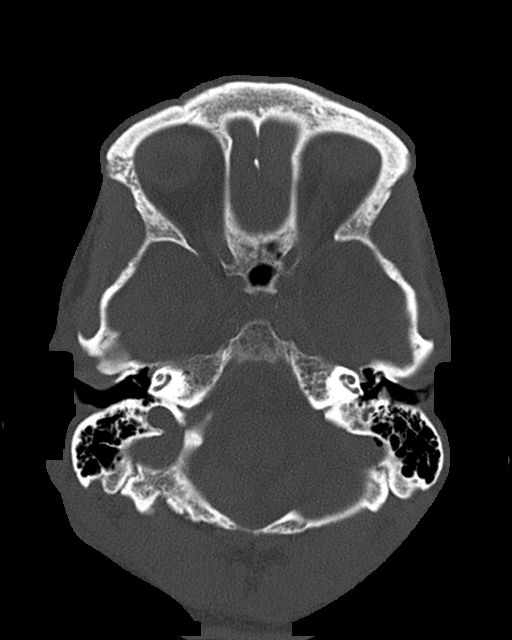
[im 16/70  bone]
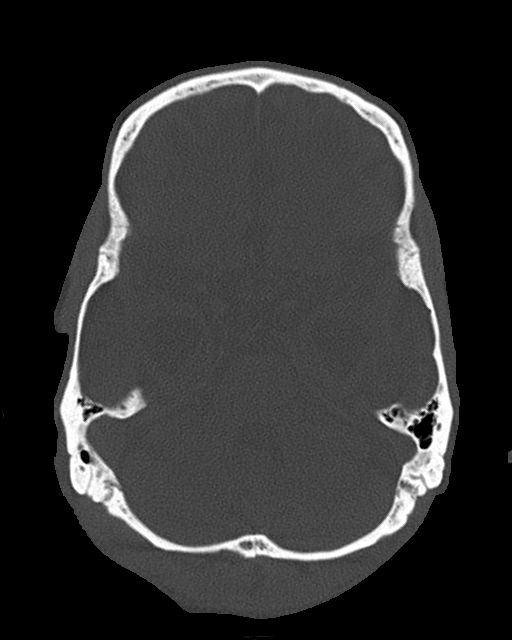
[im 24/70  bone]
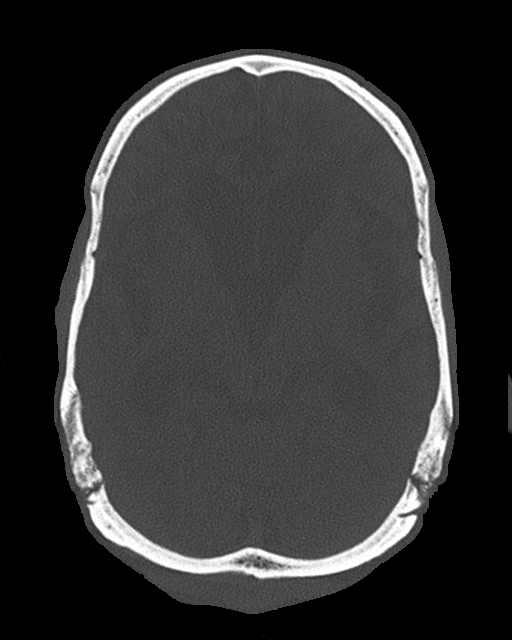
[im 31/70  bone]
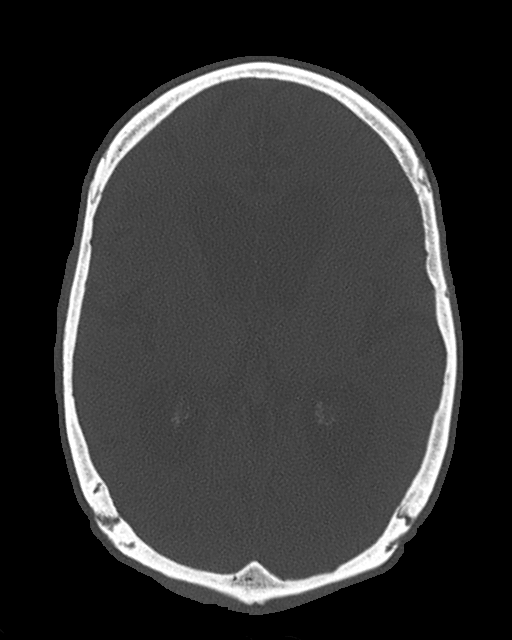

[Series 12: coronal soft tissue · sagittal · 0.22mm/px · 3 of 59 slices shown]
[im 20/59  brain]
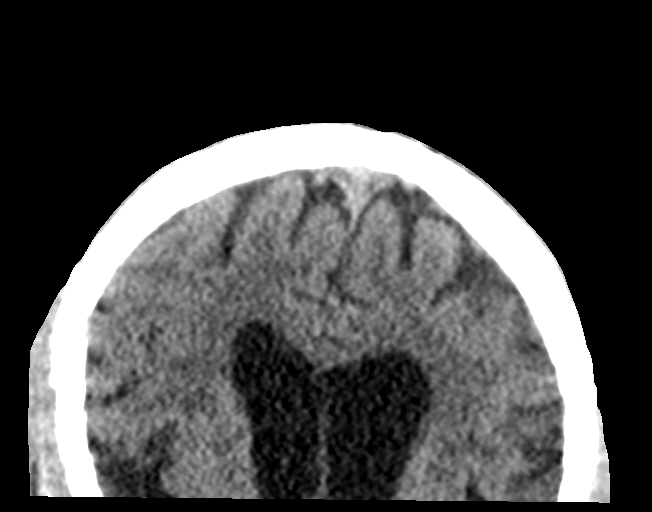
[im 30/59  brain]
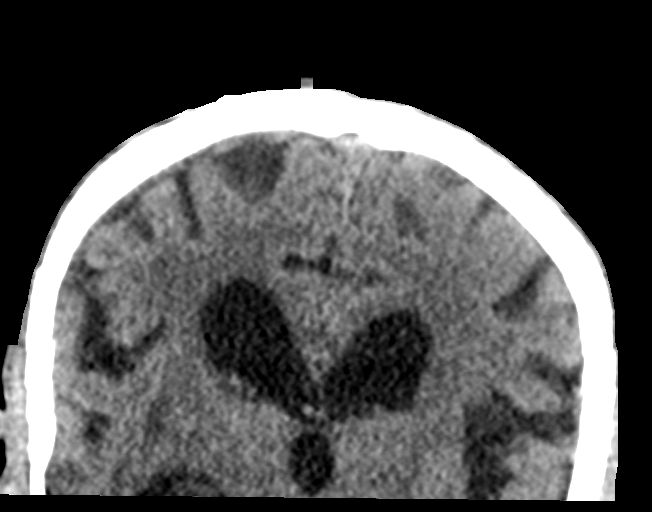
[im 39/59  brain]
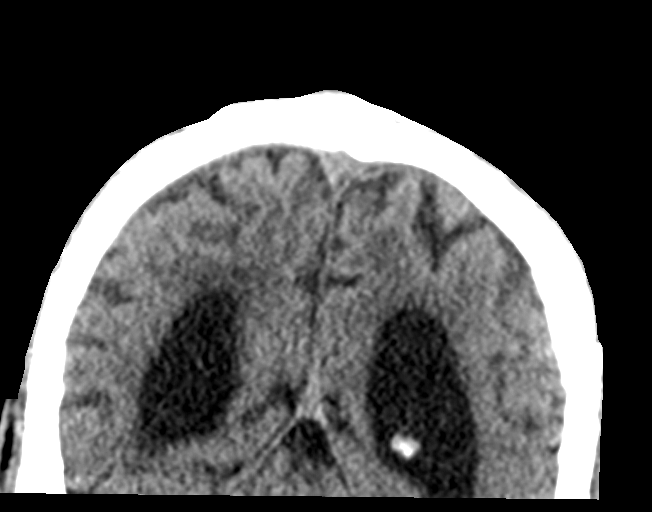

[Series 13: sagittal soft tissue · coronal · 0.19mm/px · 3 of 45 slices shown]
[im 15/45  brain]
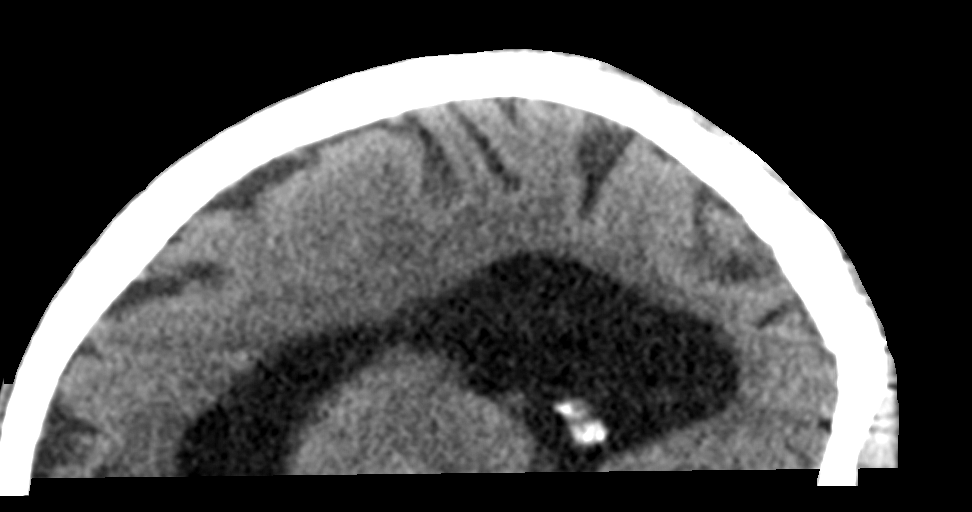
[im 20/45  brain]
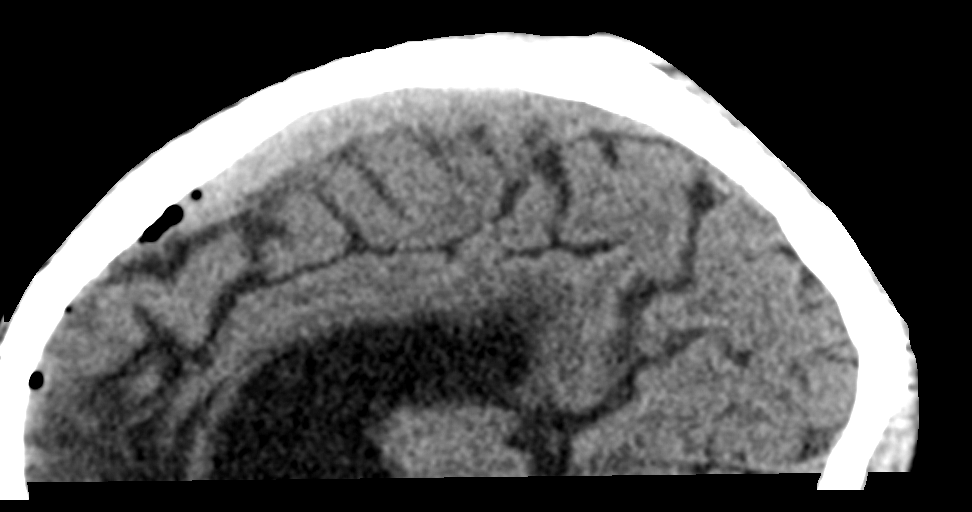
[im 25/45  brain]
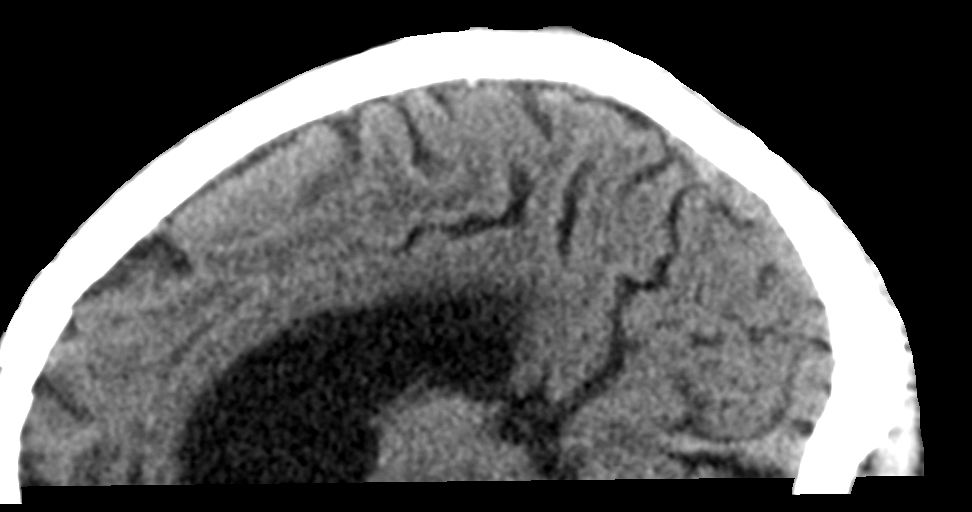

[14 of 47 positions shown; findings below may reference images not displayed]

FINDINGS: Brain: No acute territorial infarction or intracranial hemorrhage is
visualized. There is no focal mass, mass effect or midline shift.
Previously noted right chronic subdural is less apparent. Moderate
atrophy. Enlarged ventricles are similar in configuration.
Subcortical hypodensity within the bifrontal white matter again
noted. Tiny bubbles of air within the anterior venous sinus, likely
iatrogenic.

Vascular: No hyperdense vessel or unexpected calcification.

Skull: Mastoid air cells are clear.  No skull fracture.

Sinuses/Orbits: No acute finding.

Other: Small to moderate right forehead soft tissue hematoma.
IMPRESSION: 1. No definite CT evidence for acute intracranial abnormality.
2. Atrophy. Low attenuation within the bilateral frontal lobe white
matter, unchanged, possibly posttraumatic.
3. Small right forehead hematoma.

## 2017-07-12 IMAGING — DX DG CHEST 1V PORT
1 series · 1 of 1 positions shown · non-contrast
Comparison: 12/31/2015

CLINICAL DATA: Down syndrome common

EXAM:
PORTABLE CHEST 1 VIEW

[chest ap]
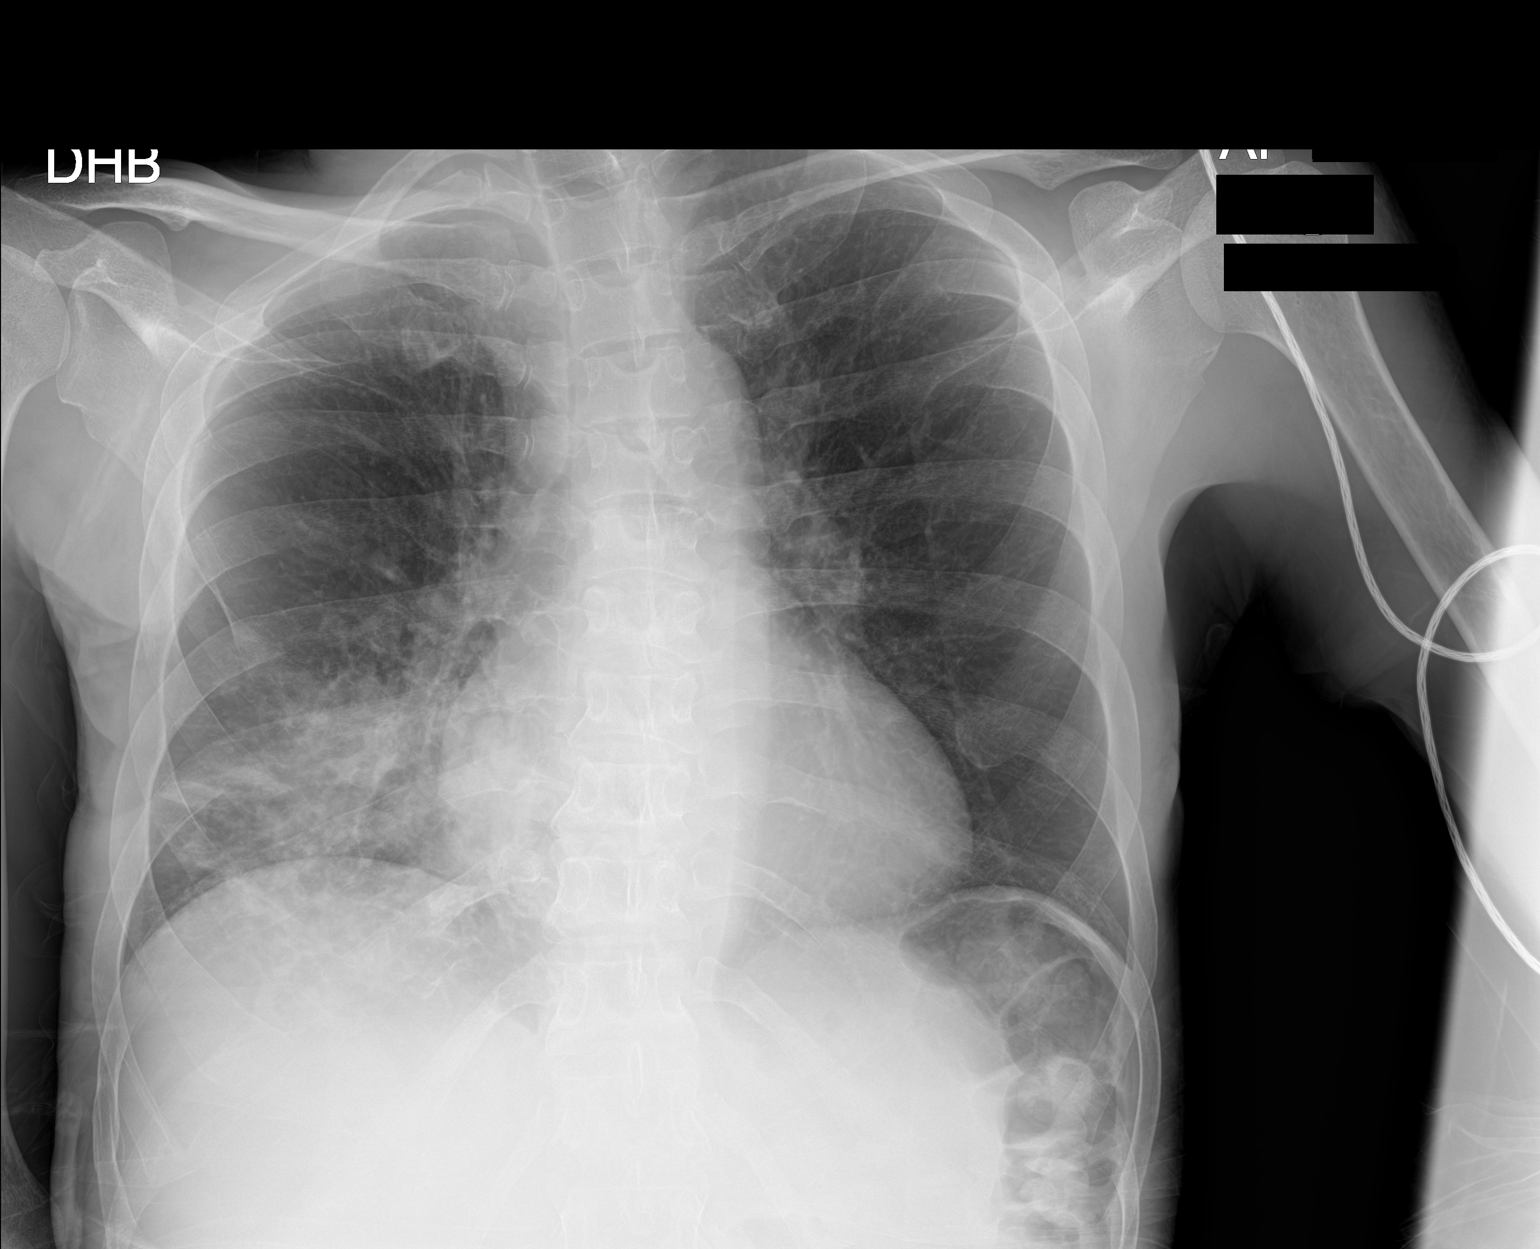

[1 of 1 positions shown; findings below may reference images not displayed]

FINDINGS: Patchy right lower lobe opacity, suspicious for pneumonia. Left lung
is clear. No pleural effusion or pneumothorax.

The heart is normal in size.
IMPRESSION: Patchy right lower lobe opacity, suspicious for pneumonia.

## 2018-06-04 DEATH — deceased
